# Patient Record
Sex: Female | Born: 1935 | Race: White | Hispanic: No | Smoking: Never smoker
Health system: Southern US, Community
[De-identification: ages and names within clinical notes are randomized; demographics above are authoritative.]

## PROBLEM LIST (undated history)

## (undated) DIAGNOSIS — K219 Gastro-esophageal reflux disease without esophagitis: Secondary | ICD-10-CM

## (undated) DIAGNOSIS — J45909 Unspecified asthma, uncomplicated: Secondary | ICD-10-CM

## (undated) DIAGNOSIS — R42 Dizziness and giddiness: Secondary | ICD-10-CM

## (undated) DIAGNOSIS — Z8489 Family history of other specified conditions: Secondary | ICD-10-CM

## (undated) DIAGNOSIS — Z970 Presence of artificial eye: Secondary | ICD-10-CM

## (undated) DIAGNOSIS — T8859XA Other complications of anesthesia, initial encounter: Secondary | ICD-10-CM

## (undated) DIAGNOSIS — M1990 Unspecified osteoarthritis, unspecified site: Secondary | ICD-10-CM

## (undated) DIAGNOSIS — R011 Cardiac murmur, unspecified: Secondary | ICD-10-CM

## (undated) DIAGNOSIS — M810 Age-related osteoporosis without current pathological fracture: Secondary | ICD-10-CM

## (undated) DIAGNOSIS — T4145XA Adverse effect of unspecified anesthetic, initial encounter: Secondary | ICD-10-CM

## (undated) DIAGNOSIS — R748 Abnormal levels of other serum enzymes: Secondary | ICD-10-CM

## (undated) DIAGNOSIS — I10 Essential (primary) hypertension: Secondary | ICD-10-CM

## (undated) DIAGNOSIS — I6529 Occlusion and stenosis of unspecified carotid artery: Secondary | ICD-10-CM

## (undated) DIAGNOSIS — E785 Hyperlipidemia, unspecified: Secondary | ICD-10-CM

## (undated) HISTORY — PX: ABDOMINAL HYSTERECTOMY: SHX81

## (undated) HISTORY — DX: Occlusion and stenosis of unspecified carotid artery: I65.29

## (undated) HISTORY — PX: CHOLECYSTECTOMY: SHX55

## (undated) HISTORY — PX: HAND / FINGER LESION EXCISION: SUR531

## (undated) HISTORY — PX: BREAST SURGERY: SHX581

## (undated) HISTORY — PX: APPENDECTOMY: SHX54

---

## 1973-07-04 HISTORY — PX: EYE SURGERY: SHX253

## 2003-11-02 ENCOUNTER — Other Ambulatory Visit: Payer: Self-pay

## 2007-03-22 ENCOUNTER — Other Ambulatory Visit: Payer: Self-pay

## 2007-03-22 ENCOUNTER — Emergency Department: Payer: Self-pay

## 2011-01-03 DIAGNOSIS — C439 Malignant melanoma of skin, unspecified: Secondary | ICD-10-CM | POA: Insufficient documentation

## 2011-01-03 DIAGNOSIS — J45909 Unspecified asthma, uncomplicated: Secondary | ICD-10-CM | POA: Insufficient documentation

## 2011-01-03 DIAGNOSIS — K219 Gastro-esophageal reflux disease without esophagitis: Secondary | ICD-10-CM | POA: Insufficient documentation

## 2011-01-03 DIAGNOSIS — E119 Type 2 diabetes mellitus without complications: Secondary | ICD-10-CM | POA: Insufficient documentation

## 2011-01-03 DIAGNOSIS — R55 Syncope and collapse: Secondary | ICD-10-CM | POA: Insufficient documentation

## 2011-01-03 DIAGNOSIS — M810 Age-related osteoporosis without current pathological fracture: Secondary | ICD-10-CM | POA: Insufficient documentation

## 2011-01-03 DIAGNOSIS — I10 Essential (primary) hypertension: Secondary | ICD-10-CM | POA: Insufficient documentation

## 2011-04-21 ENCOUNTER — Inpatient Hospital Stay: Payer: Self-pay | Admitting: Internal Medicine

## 2011-06-02 ENCOUNTER — Ambulatory Visit: Payer: Self-pay | Admitting: Gastroenterology

## 2011-08-28 ENCOUNTER — Emergency Department: Payer: Self-pay | Admitting: Emergency Medicine

## 2011-08-28 LAB — COMPREHENSIVE METABOLIC PANEL
Albumin: 3.8 g/dL (ref 3.4–5.0)
Alkaline Phosphatase: 61 U/L (ref 50–136)
BUN: 9 mg/dL (ref 7–18)
Creatinine: 0.83 mg/dL (ref 0.60–1.30)
Glucose: 99 mg/dL (ref 65–99)
Potassium: 4.1 mmol/L (ref 3.5–5.1)
SGOT(AST): 20 U/L (ref 15–37)
SGPT (ALT): 27 U/L
Total Protein: 7.7 g/dL (ref 6.4–8.2)

## 2011-08-28 LAB — PRO B NATRIURETIC PEPTIDE: B-Type Natriuretic Peptide: 105 pg/mL (ref 0–450)

## 2011-08-28 LAB — CBC
HGB: 14.7 g/dL (ref 12.0–16.0)
Platelet: 226 10*3/uL (ref 150–440)
RBC: 4.56 10*6/uL (ref 3.80–5.20)
WBC: 10.9 10*3/uL (ref 3.6–11.0)

## 2012-02-17 DIAGNOSIS — Z961 Presence of intraocular lens: Secondary | ICD-10-CM | POA: Insufficient documentation

## 2012-03-06 DIAGNOSIS — E785 Hyperlipidemia, unspecified: Secondary | ICD-10-CM | POA: Insufficient documentation

## 2013-06-04 ENCOUNTER — Ambulatory Visit: Payer: Self-pay | Admitting: Family Medicine

## 2013-10-17 ENCOUNTER — Ambulatory Visit: Payer: Self-pay | Admitting: Family Medicine

## 2014-12-03 ENCOUNTER — Encounter: Payer: Self-pay | Admitting: *Deleted

## 2014-12-03 DIAGNOSIS — Z789 Other specified health status: Secondary | ICD-10-CM | POA: Insufficient documentation

## 2014-12-03 NOTE — Discharge Instructions (Signed)

## 2014-12-04 ENCOUNTER — Telehealth: Payer: Self-pay | Admitting: Gastroenterology

## 2014-12-04 ENCOUNTER — Ambulatory Visit
Admission: RE | Admit: 2014-12-04 | Discharge: 2014-12-04 | Disposition: A | Discharge status: Home / Self Care | Payer: Medicare Other | Source: Ambulatory Visit | Attending: Gastroenterology | Admitting: Gastroenterology

## 2014-12-04 ENCOUNTER — Ambulatory Visit: Payer: Medicare Other | Admitting: Anesthesiology

## 2014-12-04 ENCOUNTER — Encounter
Admission: RE | Disposition: A | Discharge status: Home / Self Care | Payer: Self-pay | Source: Ambulatory Visit | Attending: Gastroenterology

## 2014-12-04 ENCOUNTER — Other Ambulatory Visit: Payer: Self-pay | Admitting: Gastroenterology

## 2014-12-04 DIAGNOSIS — I10 Essential (primary) hypertension: Secondary | ICD-10-CM | POA: Insufficient documentation

## 2014-12-04 DIAGNOSIS — K2970 Gastritis, unspecified, without bleeding: Secondary | ICD-10-CM | POA: Diagnosis not present

## 2014-12-04 DIAGNOSIS — J45909 Unspecified asthma, uncomplicated: Secondary | ICD-10-CM | POA: Insufficient documentation

## 2014-12-04 DIAGNOSIS — Z79899 Other long term (current) drug therapy: Secondary | ICD-10-CM | POA: Insufficient documentation

## 2014-12-04 DIAGNOSIS — E785 Hyperlipidemia, unspecified: Secondary | ICD-10-CM | POA: Diagnosis not present

## 2014-12-04 DIAGNOSIS — K219 Gastro-esophageal reflux disease without esophagitis: Secondary | ICD-10-CM | POA: Diagnosis not present

## 2014-12-04 DIAGNOSIS — M1990 Unspecified osteoarthritis, unspecified site: Secondary | ICD-10-CM | POA: Diagnosis not present

## 2014-12-04 DIAGNOSIS — K2950 Unspecified chronic gastritis without bleeding: Secondary | ICD-10-CM | POA: Diagnosis not present

## 2014-12-04 DIAGNOSIS — Z7951 Long term (current) use of inhaled steroids: Secondary | ICD-10-CM | POA: Insufficient documentation

## 2014-12-04 DIAGNOSIS — K228 Other specified diseases of esophagus: Secondary | ICD-10-CM | POA: Diagnosis not present

## 2014-12-04 DIAGNOSIS — R12 Heartburn: Secondary | ICD-10-CM

## 2014-12-04 HISTORY — DX: Essential (primary) hypertension: I10

## 2014-12-04 HISTORY — DX: Presence of artificial eye: Z97.0

## 2014-12-04 HISTORY — DX: Adverse effect of unspecified anesthetic, initial encounter: T41.45XA

## 2014-12-04 HISTORY — PX: ESOPHAGOGASTRODUODENOSCOPY: SHX5428

## 2014-12-04 HISTORY — DX: Family history of other specified conditions: Z84.89

## 2014-12-04 HISTORY — DX: Other complications of anesthesia, initial encounter: T88.59XA

## 2014-12-04 HISTORY — DX: Dizziness and giddiness: R42

## 2014-12-04 HISTORY — DX: Age-related osteoporosis without current pathological fracture: M81.0

## 2014-12-04 HISTORY — DX: Abnormal levels of other serum enzymes: R74.8

## 2014-12-04 HISTORY — DX: Hyperlipidemia, unspecified: E78.5

## 2014-12-04 HISTORY — DX: Cardiac murmur, unspecified: R01.1

## 2014-12-04 HISTORY — DX: Unspecified asthma, uncomplicated: J45.909

## 2014-12-04 HISTORY — DX: Unspecified osteoarthritis, unspecified site: M19.90

## 2014-12-04 HISTORY — DX: Gastro-esophageal reflux disease without esophagitis: K21.9

## 2014-12-04 SURGERY — EGD (ESOPHAGOGASTRODUODENOSCOPY)
Anesthesia: Monitor Anesthesia Care | Wound class: Clean Contaminated

## 2014-12-04 MED ORDER — ACETAMINOPHEN 160 MG/5ML PO SOLN
325.0000 mg | ORAL | Status: DC | PRN
Start: 2014-12-04 — End: 2014-12-04

## 2014-12-04 MED ORDER — GLYCOPYRROLATE 0.2 MG/ML IJ SOLN
INTRAMUSCULAR | Status: DC | PRN
  Administered 2014-12-04: 0.1 mg via INTRAVENOUS

## 2014-12-04 MED ORDER — LACTATED RINGERS IV SOLN
INTRAVENOUS | Status: DC
  Administered 2014-12-04 (×3): via INTRAVENOUS

## 2014-12-04 MED ORDER — ACETAMINOPHEN 325 MG PO TABS: 325.0000 mg | ORAL_TABLET | ORAL | Status: DC | PRN

## 2014-12-04 MED ORDER — LIDOCAINE HCL (CARDIAC) 20 MG/ML IV SOLN
INTRAVENOUS | Status: DC | PRN
  Administered 2014-12-04: 30 mg via INTRAVENOUS

## 2014-12-04 MED ORDER — PROPOFOL 10 MG/ML IV BOLUS
INTRAVENOUS | Status: DC | PRN
  Administered 2014-12-04: 60 mg via INTRAVENOUS
  Administered 2014-12-04 (×3): 20 mg via INTRAVENOUS

## 2014-12-04 SURGICAL SUPPLY — 39 items
BALLN DILATOR 10-12 8 (BALLOONS)
BALLN DILATOR 12-15 8 (BALLOONS)
BALLN DILATOR 15-18 8 (BALLOONS)
BALLN DILATOR CRE 0-12 8 (BALLOONS)
BALLN DILATOR ESOPH 8 10 CRE (MISCELLANEOUS) IMPLANT
BALLOON DILATOR 12-15 8 (BALLOONS) IMPLANT
BALLOON DILATOR 15-18 8 (BALLOONS) IMPLANT
BALLOON DILATOR CRE 0-12 8 (BALLOONS) IMPLANT
BLOCK BITE 60FR ADLT L/F GRN (MISCELLANEOUS) ×3 IMPLANT
CANISTER SUCT 1200ML W/VALVE (MISCELLANEOUS) ×3 IMPLANT
FCP ESCP3.2XJMB 240X2.8X (MISCELLANEOUS)
FORCEPS BIOP RAD 4 LRG CAP 4 (CUTTING FORCEPS) IMPLANT
FORCEPS BIOP RJ4 240 W/NDL (MISCELLANEOUS)
FORCEPS ESCP3.2XJMB 240X2.8X (MISCELLANEOUS) IMPLANT
GOWN CVR UNV OPN BCK APRN NK (MISCELLANEOUS) ×2 IMPLANT
GOWN ISOL THUMB LOOP REG UNIV (MISCELLANEOUS) ×4
HEMOCLIP INSTINCT (CLIP) IMPLANT
INJECTOR VARIJECT VIN23 (MISCELLANEOUS) IMPLANT
KIT CO2 TUBING (TUBING) IMPLANT
KIT DEFENDO VALVE AND CONN (KITS) IMPLANT
KIT ENDO PROCEDURE OLY (KITS) ×3 IMPLANT
LIGATOR MULTIBAND 6SHOOTER MBL (MISCELLANEOUS) IMPLANT
MARKER SPOT ENDO TATTOO 5ML (MISCELLANEOUS) IMPLANT
PAD GROUND ADULT SPLIT (MISCELLANEOUS) IMPLANT
SNARE SHORT THROW 13M SML OVAL (MISCELLANEOUS) IMPLANT
SNARE SHORT THROW 30M LRG OVAL (MISCELLANEOUS) IMPLANT
SPOT EX ENDOSCOPIC TATTOO (MISCELLANEOUS)
SUCTION POLY TRAP 4CHAMBER (MISCELLANEOUS) IMPLANT
SYR INFLATION 60ML (SYRINGE) IMPLANT
TRAP SUCTION POLY (MISCELLANEOUS) IMPLANT
TUBING CONN 6MMX3.1M (TUBING)
TUBING SUCTION CONN 0.25 STRL (TUBING) IMPLANT
UNDERPAD 30X60 958B10 (PK) (MISCELLANEOUS) IMPLANT
VALVE BIOPSY ENDO (VALVE) IMPLANT
VARIJECT INJECTOR VIN23 (MISCELLANEOUS)
WATER AUXILLARY (MISCELLANEOUS) IMPLANT
WATER STERILE IRR 250ML POUR (IV SOLUTION) ×3 IMPLANT
WATER STERILE IRR 500ML POUR (IV SOLUTION) IMPLANT
WIRE CRE 18-20MM 8CM F G (MISCELLANEOUS) IMPLANT

## 2014-12-04 NOTE — Op Note (Signed)
[facility] Patient Name: Jaime Berger Procedure Date: 12/04/2014 8:22 AM MRN: [id number] Account #: [id number] Date of Birth: 1930-01-07 Admit Type: Outpatient Age: 81 Room: [facility] OR ROOM 01 Gender: Female Note Status: Finalized Procedure:         Upper GI endoscopy Indications:       Heartburn Providers:         Lucilla Lame, MD Referring MD:      Kerin Perna, MD (Referring MD) Medicines:         Propofol per Anesthesia Complications:     No immediate complications. Procedure:         Pre-Anesthesia Assessment:                    - Prior to the procedure, a History and Physical was                     performed, and patient medications and allergies were                     reviewed. The patient's tolerance of previous anesthesia                     was also reviewed. The risks and benefits of the procedure                     and the sedation options and risks were discussed with the                     patient. All questions were answered, and informed consent                     was obtained. Prior Anticoagulants: The patient has taken                     no previous anticoagulant or antiplatelet agents. ASA                     Grade Assessment: II - A patient with mild systemic                     disease. After reviewing the risks and benefits, the                     patient was deemed in satisfactory condition to undergo                     the procedure.                    After obtaining informed consent, the endoscope was passed                     under direct vision. Throughout the procedure, the                     patient's blood pressure, pulse, and oxygen saturations                     were monitored continuously. The Olympus GIF H180J                     colonscope (B#:[id number]) was introduced through the mouth,  and advanced to the second part of duodenum. The upper GI                     endoscopy was  accomplished without difficulty. The patient                     tolerated the procedure well. Findings:      The Z-line was irregular and was found at the gastroesophageal junction.       Biopsies were taken with a cold forceps for histology.      Localized moderate inflammation characterized by erythema was found in       the gastric antrum. Biopsies were taken with a cold forceps for       histology.      The examined duodenum was normal. Impression:        - Z-line irregular, at the gastroesophageal junction.                     Biopsied.                    - Gastritis. Biopsied.                    - Normal examined duodenum. Recommendation:    - Await pathology results. Procedure Code(s): --- Professional ---                    [phone], Esophagogastroduodenoscopy, flexible, transoral;                     with biopsy, single or multiple Diagnosis Code(s): --- Professional ---                    R12, Heartburn                    K22.8, Other specified diseases of esophagus                    K29.70, Gastritis, unspecified, without bleeding CPT copyright 2014 American Medical Association. All rights reserved. The codes documented in this report are preliminary and upon coder review may  be revised to meet current compliance requirements. Lucilla Lame, MD 12/04/2014 8:44:40 AM This report has been signed electronically. Number of Addenda: 0 Note Initiated On: 12/04/2014 8:22 AM Total Procedure Duration: 0 hours 3 minutes 15 seconds       [facility]

## 2014-12-04 NOTE — H&P (Signed)
[facility]  [address] [place], [place] [address] Phone: [phone] Fax : [phone]  Primary Care Physician:  Hortencia Pilar, MD Primary Gastroenterologist:  Dr. Allen Norris  Pre-Procedure History & Physical: HPI:  Jaime Berger is a 81 y.o. female is here for an endoscopy.   Past Medical History  Diagnosis Date  . Family history of adverse reaction to anesthesia     nausea  . Complication of anesthesia     Pt reports during Colonoscopy at [facility] was "overdosed" on meds and died and was brought back  . Hypertension   . Heart murmur     "Yrs ago", no mention recently  . Arthritis     legs, hands, "everywhere"  . GERD (gastroesophageal reflux disease)   . Asthma   . Osteoporosis     back  . Prosthetic eye globe     right  . Vertigo   . Hyperlipidemia   . Liver enzyme elevation     Past Surgical History  Procedure Laterality Date  . Breast surgery Right     lumpectomy  . Cholecystectomy    . Appendectomy    . Abdominal hysterectomy    . Eye surgery Right 1975     prosthesis after melanoma  . Hand / finger lesion excision      "nodules"    Prior to Admission medications   Medication Sig Start Date End Date Taking? Authorizing Provider  albuterol (PROVENTIL HFA;VENTOLIN HFA) 108 (90 BASE) MCG/ACT inhaler Inhale into the lungs every 6 (six) hours as needed for wheezing or shortness of breath.   Yes Historical Provider, MD  budesonide-formoterol (SYMBICORT) 160-4.5 MCG/ACT inhaler Inhale 2 puffs into the lungs 2 (two) times daily. Usually only uses in AM   Yes Historical Provider, MD  carvedilol (COREG) 6.25 MG tablet Take 6.25 mg by mouth daily. 10 AM   Yes Historical Provider, MD  dexlansoprazole (DEXILANT) 60 MG capsule Take 60 mg by mouth daily. PM   Yes Historical Provider, MD  diltiazem (CARDIZEM) 120 MG tablet Take 120 mg by mouth daily. 10 AM   Yes Historical Provider, MD  losartan (COZAAR) 100 MG tablet Take 100 mg by mouth daily. 10 AM    Yes Historical Provider, MD    Allergies as of 12/03/2014 - Review Complete 12/03/2014  Allergen Reaction Noted  . Lipitor [atorvastatin] Other (See Comments) 12/03/2014  . Lovastatin Other (See Comments) 12/03/2014  . Doxycycline Nausea And Vomiting 12/03/2014  . Strawberry Itching 12/03/2014  . Sulfa antibiotics Hives 12/03/2014  . Metformin and related Palpitations 12/03/2014    History reviewed. No pertinent family history.  History   Social History  . Marital Status: Widowed    Spouse Name: N/A  . Number of Children: N/A  . Years of Education: N/A   Occupational History  . Not on file.   Social History Main Topics  . Smoking status: Never Smoker   . Smokeless tobacco: Not on file  . Alcohol Use: No  . Drug Use: Not on file  . Sexual Activity: Not on file   Other Topics Concern  . Not on file   Social History Narrative  . No narrative on file    Review of Systems: See HPI, otherwise negative ROS  Physical Exam: BP 167/74 mmHg  Pulse 67  Temp(Src) 97.5 F (36.4 C) (Temporal)  Resp 16  Ht 5\' 1"  (1.549 m)  Wt 141 lb (63.957 kg)  BMI 26.66 kg/m2  SpO2 98% General:  Alert,  pleasant and cooperative in NAD Head:  Normocephalic and atraumatic. Neck:  Supple; no masses or thyromegaly. Lungs:  Clear throughout to auscultation.    Heart:  Regular rate and rhythm. Abdomen:  Soft, nontender and nondistended. Normal bowel sounds, without guarding, and without rebound.   Neurologic:  Alert and  oriented x4;  grossly normal neurologically.  Impression/Plan: Jaime Berger is here for an endoscopy to be performed for GERD  Risks, benefits, limitations, and alternatives regarding  endoscopy have been reviewed with the patient.  Questions have been answered.  All parties agreeable.   Ollen Bowl, MD  12/04/2014, 8:26 AM

## 2014-12-04 NOTE — Anesthesia Postprocedure Evaluation (Signed)
  Anesthesia Post-op Note  Patient: Jaime Berger  Procedure(s) Performed: Procedure(s) with comments: ESOPHAGOGASTRODUODENOSCOPY (EGD) (N/A) - REFERRING DR Hortencia Pilar  Anesthesia type:MAC  Patient location: PACU  Post pain: Pain level controlled  Post assessment: Post-op Vital signs reviewed, Patient's Cardiovascular Status Stable, Respiratory Function Stable, Patent Airway and No signs of Nausea or vomiting  Post vital signs: Reviewed and stable  Last Vitals:  Filed Vitals:   12/04/14 0930  BP: 197/70  Pulse: 66  Temp:   Resp: 16    Level of consciousness: awake, alert  and patient cooperative  Complications: No apparent anesthesia complications

## 2014-12-04 NOTE — Anesthesia Preprocedure Evaluation (Addendum)
Anesthesia Evaluation  Patient identified by MRN, date of birth, ID band  Reviewed: Allergy & Precautions, H&P , NPO status , Patient's Chart, lab work & pertinent test results  Airway Mallampati: II  TM Distance: >3 FB Neck ROM: full    Dental no notable dental hx.    Pulmonary    Pulmonary exam normal       Cardiovascular hypertension, Rhythm:regular Rate:Normal + Systolic murmurs    Neuro/Psych    GI/Hepatic   Endo/Other    Renal/GU      Musculoskeletal   Abdominal   Peds  Hematology   Anesthesia Other Findings   Reproductive/Obstetrics                            Anesthesia Physical Anesthesia Plan  ASA: II  Anesthesia Plan: MAC   Post-op Pain Management:    Induction:   Airway Management Planned:   Additional Equipment:   Intra-op Plan:   Post-operative Plan:   Informed Consent: I have reviewed the patients History and Physical, chart, labs and discussed the procedure including the risks, benefits and alternatives for the proposed anesthesia with the patient or authorized representative who has indicated his/her understanding and acceptance.     Plan Discussed with: CRNA  Anesthesia Plan Comments:         Anesthesia Quick Evaluation

## 2014-12-04 NOTE — Telephone Encounter (Signed)
Call daughter for results

## 2014-12-04 NOTE — Transfer of Care (Signed)
Immediate Anesthesia Transfer of Care Note  Patient: Jaime Berger  Procedure(s) Performed: Procedure(s) with comments: ESOPHAGOGASTRODUODENOSCOPY (EGD) (N/A) - REFERRING DR Hortencia Pilar  Patient Location: PACU  Anesthesia Type: MAC  Level of Consciousness: awake, alert  and patient cooperative  Airway and Oxygen Therapy: Patient Spontanous Breathing and Patient connected to supplemental oxygen  Post-op Assessment: Post-op Vital signs reviewed, Patient's Cardiovascular Status Stable, Respiratory Function Stable, Patent Airway and No signs of Nausea or vomiting  Post-op Vital Signs: Reviewed and stable  Complications: No apparent anesthesia complications

## 2014-12-04 NOTE — Telephone Encounter (Signed)
Spoke with pt about her results. Advised her I have tried contacting her daughter, but number is incorrect.

## 2014-12-05 ENCOUNTER — Encounter: Payer: Self-pay | Admitting: Gastroenterology

## 2014-12-05 ENCOUNTER — Other Ambulatory Visit: Payer: Self-pay

## 2014-12-05 DIAGNOSIS — R748 Abnormal levels of other serum enzymes: Secondary | ICD-10-CM

## 2014-12-10 NOTE — Telephone Encounter (Signed)
Spoke with pt's daughter regarding her results. Advised her Dr. Allen Norris has added more labs. Pt will go have these done as soon as she can. Dr. Allen Norris has requested pt to follow up with him once these are done.

## 2014-12-17 ENCOUNTER — Telehealth: Payer: Self-pay

## 2014-12-17 NOTE — Telephone Encounter (Signed)
-----   Message from Lucilla Lame, MD sent at 12/08/2014 11:44 AM EDT ----- Have the patient come in for an office visit to discuss the pathology.

## 2014-12-17 NOTE — Telephone Encounter (Signed)
Spoke with pt's daughter. Pt scheduled for a f/u of pathology results on 01-01-15.

## 2015-01-01 ENCOUNTER — Encounter: Payer: Self-pay | Admitting: Gastroenterology

## 2015-01-01 ENCOUNTER — Ambulatory Visit (INDEPENDENT_AMBULATORY_CARE_PROVIDER_SITE_OTHER): Payer: Medicare Other | Admitting: Gastroenterology

## 2015-01-01 VITALS — BP 171/80 | HR 66 | Temp 98.6°F | Ht 61.5 in | Wt 146.0 lb

## 2015-01-01 DIAGNOSIS — H5440 Blindness, one eye, unspecified eye: Secondary | ICD-10-CM | POA: Insufficient documentation

## 2015-01-01 DIAGNOSIS — K219 Gastro-esophageal reflux disease without esophagitis: Secondary | ICD-10-CM | POA: Diagnosis not present

## 2015-01-01 NOTE — Progress Notes (Signed)
Primary Care Physician: Hortencia Pilar, MD  Primary Gastroenterologist:  Dr. Lucilla Lame  Chief Complaint  Patient presents with  . F/U procedure results    HPI: Jaime Berger is a 81 y.o. female here for follow-up of abnormal liver enzymes and biopsies of her stomach. The patient states that she has been doing much better on the [place] and when she tried to stop it had the nausea and vomiting return. The patient states she is now gaining weight and has been feeling well. The patient's liver enzymes have been elevated her most recent blood work showed that she had no source of the abnormal liver enzymes. The patient did have an ultrasound that showed fatty liver. The patient had an upper endoscopy that showed an irregular Z line incision with chronic history of reflux and intestinal metaplasia of the stomach.  Current Outpatient Prescriptions  Medication Sig Dispense Refill  . albuterol (PROVENTIL HFA;VENTOLIN HFA) 108 (90 BASE) MCG/ACT inhaler Inhale into the lungs every 6 (six) hours as needed for wheezing or shortness of breath.    . budesonide-formoterol (SYMBICORT) 160-4.5 MCG/ACT inhaler Inhale 2 puffs into the lungs 2 (two) times daily. Usually only uses in AM    . carvedilol (COREG) 6.25 MG tablet Take 6.25 mg by mouth daily. 10 AM    . dexlansoprazole (DEXILANT) 60 MG capsule Take 60 mg by mouth daily. PM    . diltiazem (CARDIZEM) 120 MG tablet Take 120 mg by mouth daily. 10 AM    . ipratropium (ATROVENT) 0.06 % nasal spray Place into the nose.    . losartan (COZAAR) 100 MG tablet Take 100 mg by mouth daily. 10 AM    . Nutritional Supplements (PROTEIN SUPPLEMENT 80% PO) Take by mouth.    Marland Kitchen aspirin 81 MG chewable tablet Chew by mouth.    . CARTIA XT 120 MG 24 hr capsule   0  . diclofenac sodium (VOLTAREN) 1 % GEL APP 2 GRAMS EXT AA BID PRN  0  . erythromycin ophthalmic ointment APP THIN LAYER IN OD TID  0  . Vitamin D, Ergocalciferol, (DRISDOL) 50000 UNITS CAPS capsule Take  by mouth.     No current facility-administered medications for this visit.    Allergies as of 01/01/2015 - Review Complete 01/01/2015  Allergen Reaction Noted  . Lipitor [atorvastatin] Other (See Comments) 12/03/2014  . Lovastatin Other (See Comments) 12/03/2014  . Doxycycline Nausea And Vomiting 12/03/2014  . Sulfa antibiotics Hives 12/03/2014  . Metformin and related Palpitations 12/03/2014    ROS:  General: Negative for anorexia, weight loss, fever, chills, fatigue, weakness. ENT: Negative for hoarseness, difficulty swallowing , nasal congestion. CV: Negative for chest pain, angina, palpitations, dyspnea on exertion, peripheral edema.  Respiratory: Negative for dyspnea at rest, dyspnea on exertion, cough, sputum, wheezing.  GI: See history of present illness. GU:  Negative for dysuria, hematuria, urinary incontinence, urinary frequency, nocturnal urination.  Endo: Negative for unusual weight change.    Physical Examination:   BP 171/80 mmHg  Pulse 66  Temp(Src) 98.6 F (37 C) (Oral)  Ht 5' 1.5" (1.562 m)  Wt 146 lb (66.225 kg)  BMI 27.14 kg/m2  General: Well-nourished, well-developed in no acute distress.  Eyes: No icterus. Conjunctivae pink. Mouth: Oropharyngeal mucosa moist and pink , no lesions erythema or exudate. Lungs: Clear to auscultation bilaterally. Non-labored. Heart: Regular rate and rhythm, no murmurs rubs or gallops.  Abdomen: Bowel sounds are normal, nontender, nondistended, no hepatosplenomegaly or masses, no abdominal bruits or  hernia , no rebound or guarding.   Extremities: No lower extremity edema. No clubbing or deformities. Neuro: Alert and oriented x 3.  Grossly intact. Skin: Warm and dry, no jaundice.   Psych: Alert and cooperative, normal mood and affect.  Labs:    Imaging Studies: No results found.  Assessment and Plan:   Jaime Berger is a 81 y.o. y/o female comes today for follow-up of abnormal liver enzymes and biopsies of her  stomach. The biopsies of the stomach showed intestinal metaplasia and she will have her blood sent off for H. pylori. The patient will be treated for H. pylori if the intestinal metaplasia is caused by her having H. pylori. The patient has also been sent off to have her liver enzymes rechecked. The patient's increased liver enzymes are likely caused from fatty liver and a liver biopsy on this very pleasant 81 year old would not be advisable at this time. The patient does very poorly off of PPI and despite having risks associated with the PPI her positive life is much worse when she is not on a PPI. We'll continue the PPI. The patient has been explained the plan and agrees with it.

## 2015-01-02 LAB — HEPATIC FUNCTION PANEL
ALBUMIN: 4.4 g/dL (ref 3.5–4.7)
ALT: 53 IU/L — ABNORMAL HIGH (ref 0–32)
AST: 79 IU/L — ABNORMAL HIGH (ref 0–40)
Alkaline Phosphatase: 67 IU/L (ref 39–117)
Bilirubin Total: 0.5 mg/dL (ref 0.0–1.2)
Bilirubin, Direct: 0.18 mg/dL (ref 0.00–0.40)
Total Protein: 6.9 g/dL (ref 6.0–8.5)

## 2015-01-03 LAB — H. PYLORI ANTIBODY, IGA

## 2015-01-07 ENCOUNTER — Telehealth: Payer: Self-pay

## 2015-01-07 NOTE — Telephone Encounter (Signed)
-----   Message from Lucilla Lame, MD sent at 01/06/2015 12:52 PM EDT ----- Let the patient know that her liver enzymes are still elevated but the blood test for a infection of her stomach was negative. She should have her liver enzymes checked again in 3 months.

## 2015-01-07 NOTE — Telephone Encounter (Signed)
LVM for pt's daughter to return my call.  

## 2015-01-07 NOTE — Telephone Encounter (Signed)
Pt's daughter returned my call and results given. Also advised of repeat LFT's in 3 months.

## 2015-01-12 ENCOUNTER — Other Ambulatory Visit: Payer: Self-pay

## 2015-01-12 DIAGNOSIS — K2990 Gastroduodenitis, unspecified, without bleeding: Principal | ICD-10-CM

## 2015-01-12 DIAGNOSIS — K2970 Gastritis, unspecified, without bleeding: Secondary | ICD-10-CM

## 2015-01-12 MED ORDER — SUCRALFATE 1 G PO TABS: 1.0000 g | ORAL_TABLET | Freq: Four times a day (QID) | ORAL | Status: DC

## 2015-01-13 ENCOUNTER — Other Ambulatory Visit: Payer: Self-pay

## 2015-01-13 DIAGNOSIS — K2970 Gastritis, unspecified, without bleeding: Secondary | ICD-10-CM

## 2015-01-13 DIAGNOSIS — K2990 Gastroduodenitis, unspecified, without bleeding: Principal | ICD-10-CM

## 2015-01-13 MED ORDER — SUCRALFATE 1 G PO TABS: 1.0000 g | ORAL_TABLET | Freq: Four times a day (QID) | ORAL | Status: DC

## 2015-03-03 DIAGNOSIS — K2940 Chronic atrophic gastritis without bleeding: Secondary | ICD-10-CM | POA: Insufficient documentation

## 2015-04-08 ENCOUNTER — Telehealth: Payer: Self-pay

## 2015-04-08 NOTE — Telephone Encounter (Signed)
LVM for pt to return my call.

## 2015-04-08 NOTE — Telephone Encounter (Signed)
-----   Message from [place], [place] sent at 01/07/2015  1:56 PM EDT ----- Regarding: labs Call pt's daughter Sydell Axon to remind of 3 month repeat of Hepatic function.

## 2015-04-10 ENCOUNTER — Other Ambulatory Visit: Payer: Self-pay

## 2015-04-10 DIAGNOSIS — R748 Abnormal levels of other serum enzymes: Secondary | ICD-10-CM

## 2015-04-10 NOTE — Telephone Encounter (Signed)
No return call to schedule to repeat labs. Mailed lab order to pt with note to go to [facility].

## 2015-04-10 NOTE — Telephone Encounter (Signed)
-----   Message from [place]ie [place], [place] sent at 01/07/2015  1:56 PM EDT ----- Regarding: labs Call pt's daughter Sydell Axon to remind of 3 month repeat of Hepatic function.

## 2015-04-16 LAB — HEPATIC FUNCTION PANEL
ALBUMIN: 4.1 g/dL (ref 3.5–4.7)
ALK PHOS: 68 IU/L (ref 39–117)
ALT: 41 IU/L — ABNORMAL HIGH (ref 0–32)
AST: 58 IU/L — ABNORMAL HIGH (ref 0–40)
BILIRUBIN TOTAL: 0.5 mg/dL (ref 0.0–1.2)
BILIRUBIN, DIRECT: 0.18 mg/dL (ref 0.00–0.40)
Total Protein: 6.4 g/dL (ref 6.0–8.5)

## 2015-04-29 ENCOUNTER — Telehealth: Payer: Self-pay | Admitting: Gastroenterology

## 2015-04-29 NOTE — Telephone Encounter (Signed)
Please call daughter about patients labs from 10/5  [phone]

## 2015-04-29 NOTE — Telephone Encounter (Signed)
Pts daughter notified of lab results

## 2015-05-04 ENCOUNTER — Telehealth: Payer: Self-pay

## 2015-05-04 NOTE — Telephone Encounter (Signed)
Pts daughter notified of lab results

## 2015-05-04 NOTE — Telephone Encounter (Signed)
-----   Message from Lucilla Lame, MD sent at 05/04/2015 11:37 AM EDT ----- Let the patient know that the liver enzymes have gone down somewhat and she should have them checked again in 3 months.

## 2015-06-18 ENCOUNTER — Telehealth: Payer: Self-pay | Admitting: Gastroenterology

## 2015-06-18 NOTE — Telephone Encounter (Signed)
Jaime Berger called to ask a couple of questions regarding her mother, Jaime Berger.

## 2015-06-18 NOTE — Telephone Encounter (Signed)
Spoke with pt's daughter Sydell Axon regarding her mother losing 30 lbs in a little over a months time. Advised her to contact her PCP and get her in soon. If her PCP feels this is a GI related issue then to call me back and I will get her in ASAP with Dr. Allen Norris.

## 2015-06-18 NOTE — Telephone Encounter (Signed)
LVM for pt to return my call.

## 2015-09-19 DIAGNOSIS — R002 Palpitations: Secondary | ICD-10-CM | POA: Insufficient documentation

## 2015-10-07 ENCOUNTER — Other Ambulatory Visit: Payer: Self-pay

## 2015-10-07 DIAGNOSIS — R748 Abnormal levels of other serum enzymes: Secondary | ICD-10-CM

## 2015-11-05 ENCOUNTER — Inpatient Hospital Stay
Admission: EM | Admit: 2015-11-05 | Discharge: 2015-11-08 | DRG: 439 | Disposition: A | Discharge status: Home / Self Care | Payer: Medicare Other | Attending: Internal Medicine | Admitting: Internal Medicine

## 2015-11-05 ENCOUNTER — Encounter: Payer: Self-pay | Admitting: *Deleted

## 2015-11-05 DIAGNOSIS — Z85828 Personal history of other malignant neoplasm of skin: Secondary | ICD-10-CM

## 2015-11-05 DIAGNOSIS — E785 Hyperlipidemia, unspecified: Secondary | ICD-10-CM | POA: Diagnosis present

## 2015-11-05 DIAGNOSIS — I10 Essential (primary) hypertension: Secondary | ICD-10-CM | POA: Diagnosis present

## 2015-11-05 DIAGNOSIS — Z888 Allergy status to other drugs, medicaments and biological substances status: Secondary | ICD-10-CM

## 2015-11-05 DIAGNOSIS — J449 Chronic obstructive pulmonary disease, unspecified: Secondary | ICD-10-CM | POA: Diagnosis present

## 2015-11-05 DIAGNOSIS — Z79899 Other long term (current) drug therapy: Secondary | ICD-10-CM

## 2015-11-05 DIAGNOSIS — K8500 Idiopathic acute pancreatitis without necrosis or infection: Secondary | ICD-10-CM | POA: Diagnosis present

## 2015-11-05 DIAGNOSIS — K2900 Acute gastritis without bleeding: Secondary | ICD-10-CM | POA: Diagnosis present

## 2015-11-05 DIAGNOSIS — R739 Hyperglycemia, unspecified: Secondary | ICD-10-CM | POA: Diagnosis present

## 2015-11-05 DIAGNOSIS — Z7982 Long term (current) use of aspirin: Secondary | ICD-10-CM | POA: Diagnosis not present

## 2015-11-05 DIAGNOSIS — Z970 Presence of artificial eye: Secondary | ICD-10-CM | POA: Diagnosis not present

## 2015-11-05 DIAGNOSIS — K859 Acute pancreatitis without necrosis or infection, unspecified: Secondary | ICD-10-CM | POA: Diagnosis present

## 2015-11-05 DIAGNOSIS — Z882 Allergy status to sulfonamides status: Secondary | ICD-10-CM | POA: Diagnosis not present

## 2015-11-05 DIAGNOSIS — Z7951 Long term (current) use of inhaled steroids: Secondary | ICD-10-CM

## 2015-11-05 DIAGNOSIS — N390 Urinary tract infection, site not specified: Secondary | ICD-10-CM | POA: Diagnosis present

## 2015-11-05 DIAGNOSIS — K219 Gastro-esophageal reflux disease without esophagitis: Secondary | ICD-10-CM | POA: Diagnosis present

## 2015-11-05 DIAGNOSIS — R1013 Epigastric pain: Secondary | ICD-10-CM | POA: Diagnosis present

## 2015-11-05 DIAGNOSIS — H5441 Blindness, right eye, normal vision left eye: Secondary | ICD-10-CM | POA: Diagnosis present

## 2015-11-05 DIAGNOSIS — M810 Age-related osteoporosis without current pathological fracture: Secondary | ICD-10-CM | POA: Diagnosis present

## 2015-11-05 LAB — URINALYSIS COMPLETE WITH MICROSCOPIC (ARMC ONLY)
BILIRUBIN URINE: NEGATIVE
Glucose, UA: NEGATIVE mg/dL
Hgb urine dipstick: NEGATIVE
Ketones, ur: NEGATIVE mg/dL
Nitrite: NEGATIVE
PH: 6 (ref 5.0–8.0)
PROTEIN: NEGATIVE mg/dL
Specific Gravity, Urine: 1.008 (ref 1.005–1.030)

## 2015-11-05 LAB — CBC
HCT: 42.7 % (ref 35.0–47.0)
Hemoglobin: 14.2 g/dL (ref 12.0–16.0)
MCH: 32 pg (ref 26.0–34.0)
MCHC: 33.3 g/dL (ref 32.0–36.0)
MCV: 96.1 fL (ref 80.0–100.0)
PLATELETS: 220 10*3/uL (ref 150–440)
RBC: 4.44 MIL/uL (ref 3.80–5.20)
RDW: 14.3 % (ref 11.5–14.5)
WBC: 9.3 10*3/uL (ref 3.6–11.0)

## 2015-11-05 LAB — COMPREHENSIVE METABOLIC PANEL
ALBUMIN: 4.1 g/dL (ref 3.5–5.0)
ALT: 26 U/L (ref 14–54)
AST: 31 U/L (ref 15–41)
Alkaline Phosphatase: 48 U/L (ref 38–126)
Anion gap: 10 (ref 5–15)
BUN: 18 mg/dL (ref 6–20)
CHLORIDE: 103 mmol/L (ref 101–111)
CO2: 24 mmol/L (ref 22–32)
CREATININE: 0.78 mg/dL (ref 0.44–1.00)
Calcium: 9.2 mg/dL (ref 8.9–10.3)
GFR calc non Af Amer: >60 mL/min (ref >60)
Glucose, Bld: 118 mg/dL — ABNORMAL HIGH (ref 65–99)
Potassium: 4 mmol/L (ref 3.5–5.1)
SODIUM: 137 mmol/L (ref 135–145)
Total Bilirubin: 0.6 mg/dL (ref 0.3–1.2)
Total Protein: 7.1 g/dL (ref 6.5–8.1)

## 2015-11-05 LAB — LIPASE, BLOOD: LIPASE: 238 U/L — AB (ref 11–51)

## 2015-11-05 LAB — TROPONIN I: Troponin I: <0.03 ng/mL

## 2015-11-05 MED ORDER — GI COCKTAIL ~~LOC~~
30.0000 mL | Freq: Once | ORAL | Status: AC
  Administered 2015-11-05: 30 mL via ORAL

## 2015-11-05 MED ORDER — GI COCKTAIL ~~LOC~~
ORAL | Status: AC
  Filled 2015-11-05: qty 30

## 2015-11-05 MED ORDER — ONDANSETRON 4 MG PO TBDP
4.0000 mg | ORAL_TABLET | Freq: Once | ORAL | Status: AC
  Administered 2015-11-05: 4 mg via ORAL
  Filled 2015-11-05: qty 1

## 2015-11-05 MED ORDER — FLUCONAZOLE 50 MG PO TABS
150.0000 mg | ORAL_TABLET | ORAL | Status: AC
  Administered 2015-11-06: 150 mg via ORAL
  Filled 2015-11-05: qty 1

## 2015-11-05 NOTE — ED Notes (Signed)
Pt reports that she has gastritis - she states that she is not able to eat out in restaurants but that she did today and ever since she has been having nausea and vomiting - symptoms have only been occuring today and she is unable to get the nausea and vomiting under control - pt reports burning in mid epigastric area but reports no pain at this time

## 2015-11-05 NOTE — ED Notes (Signed)
Pt to ED from home with "flare up of gastritis" Pt states nausea and abd cramping that comes and goes after eating. Pt states ate sausage this am and has not felt well since. Pt denies any emesis today or diarrhea.

## 2015-11-05 NOTE — ED Notes (Signed)
Lab notified to add troponin and pt notified that we need a urine sample

## 2015-11-05 NOTE — ED Provider Notes (Signed)
[facility] Emergency Department Provider Note  Time seen: 9:36 PM  I have reviewed the triage vital signs and the nursing notes.   HISTORY  Chief Complaint Nausea and Abdominal Pain    HPI Jaime Berger is a 82 y.o. female with a past medical history of hypertension, arthritis, gastric reflux, vertigo, hyperlipidemia who presents the emergency department with upper abdominal pain and burning. According to the patient she has a significant history of gastric reflux and gastritis. She ate sausage this morning which started her epigastric abdominal pain and nausea but states it is not resolved in fact it has worsened throughout the day so eventually she came to the emergency department for evaluation. Denies any chest pain, trouble breathing, or vomiting but states she does feel very nauseous. Patient describes her pain as moderate, burning sensation to the epigastrium. Denies diarrhea, dysuria, fever. States she is sure that it is her gastritis flaring up.     Past Medical History  Diagnosis Date  . Family history of adverse reaction to anesthesia     nausea  . Complication of anesthesia     Pt reports during Colonoscopy at [facility] was "overdosed" on meds and died and was brought back  . Hypertension   . Heart murmur     "Yrs ago", no mention recently  . Arthritis     legs, hands, "everywhere"  . GERD (gastroesophageal reflux disease)   . Asthma   . Osteoporosis     back  . Prosthetic eye globe     right  . Vertigo   . Hyperlipidemia   . Liver enzyme elevation     Patient Active Problem List   Diagnosis Date Noted  . Blind one eye 01/01/2015  . Drug intolerance 12/03/2014  . HLD (hyperlipidemia) 03/06/2012  . Pseudoaphakia 02/17/2012  . Airway hyperreactivity 01/03/2011  . Controlled diabetes mellitus type II without complication ([place]) [id number]  . Acid reflux 01/03/2011  . Benign essential HTN 01/03/2011  . 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 OP (osteoporosis) 01/03/2011  . Syncope and collapse 01/03/2011    Past Surgical History  Procedure Laterality Date  . Breast surgery Right     lumpectomy  . Cholecystectomy    . Appendectomy    . Abdominal hysterectomy    . Eye surgery Right 1975     prosthesis after melanoma  . Hand / finger lesion excision      "nodules"  . Esophagogastroduodenoscopy N/A 12/04/2014    Procedure: ESOPHAGOGASTRODUODENOSCOPY (EGD);  Surgeon: Lucilla Lame, MD;  Location: [place];  Service: Gastroenterology;  Laterality: N/A;  REFERRING DR Hortencia Pilar    Current Outpatient Rx  Name  Route  Sig  Dispense  Refill  . albuterol (PROVENTIL HFA;VENTOLIN HFA) 108 (90 BASE) MCG/ACT inhaler   Inhalation   Inhale into the lungs every 6 (six) hours as needed for wheezing or shortness of breath.         Marland Kitchen aspirin 81 MG chewable tablet   Oral   Chew by mouth.         . budesonide-formoterol (SYMBICORT) 160-4.5 MCG/ACT inhaler   Inhalation   Inhale 2 puffs into the lungs 2 (two) times daily. Usually only uses in AM         . CARTIA XT 120 MG 24 hr capsule            0     Dispense as written.   . carvedilol (COREG) 6.25 MG tablet  Oral   Take 6.25 mg by mouth daily. 10 AM         . dexlansoprazole (DEXILANT) 60 MG capsule   Oral   Take 60 mg by mouth daily. PM         . diclofenac sodium (VOLTAREN) 1 % GEL      APP 2 GRAMS EXT AA BID PRN      0   . diltiazem (CARDIZEM) 120 MG tablet   Oral   Take 120 mg by mouth daily. 10 AM         . erythromycin ophthalmic ointment      APP THIN LAYER IN OD TID      0   . EXPIRED: ipratropium (ATROVENT) 0.06 % nasal spray   Nasal   Place into the nose.         . losartan (COZAAR) 100 MG tablet   Oral   Take 100 mg by mouth daily. 10 AM         . Nutritional Supplements (PROTEIN SUPPLEMENT 80% PO)   Oral   Take by mouth.         . sucralfate (CARAFATE) 1 G tablet   Oral   Take 1 tablet (1 g  total) by mouth 4 (four) times daily.   120 tablet   5   . Vitamin D, Ergocalciferol, (DRISDOL) 50000 UNITS CAPS capsule   Oral   Take by mouth.           Allergies Lipitor; Lovastatin; Doxycycline; Sulfa antibiotics; and Metformin and related  History reviewed. No pertinent family history.  Social History Social History  Substance Use Topics  . Smoking status: Never Smoker   . Smokeless tobacco: None  . Alcohol Use: No    Review of Systems Constitutional: Negative for fever. Cardiovascular: Negative for chest pain. Respiratory: Negative for shortness of breath. Gastrointestinal: Epigastric abdominal burning pain positive for nausea. Negative for vomiting or diarrhea. Genitourinary: Negative for dysuria. Musculoskeletal: Negative for back pain. Neurological: Negative for headache 10-point ROS otherwise negative.  ____________________________________________   PHYSICAL EXAM:  VITAL SIGNS: ED Triage Vitals  Enc Vitals Group     BP 11/05/15 2036 178/74 mmHg     Pulse Rate 11/05/15 2036 66     Resp 11/05/15 2036 18     Temp 11/05/15 2036 97.7 F (36.5 C)     Temp Source 11/05/15 2036 Oral     SpO2 11/05/15 2036 98 %     Weight 11/05/15 2036 136 lb (61.689 kg)     Height 11/05/15 2036 5\' 1"  (1.549 m)     Head Cir --      Peak Flow --      Pain Score 11/05/15 2037 3     Pain Loc --      Pain Edu? --      Excl. in [place]? --     Constitutional: Alert and oriented. Well appearing and in no distress. Eyes: Normal exam ENT   Head: Normocephalic and atraumatic.   Mouth/Throat: Mucous membranes are moist. Cardiovascular: Normal rate, regular rhythm. No murmur Respiratory: Normal respiratory effort without tachypnea nor retractions. Breath sounds are clear.  No chest wall tenderness palpation Gastrointestinal: Soft, mild epigastric tenderness palpation without rebound or guarding. No distention. Musculoskeletal: Nontender with normal range of motion in all  extremities.  Neurologic:  Normal speech and language. No gross focal neurologic deficits  Skin:  Skin is warm, dry and intact.  Psychiatric: Mood and affect are normal.  ____________________________________________    INITIAL IMPRESSION / ASSESSMENT AND PLAN / ED COURSE  Pertinent labs & imaging results that were available during my care of the patient were reviewed by me and considered in my medical decision making (see chart for details).  Patient presents the emergency department with epigastric discomfort/burning, along with nausea. We will check labs including a lipase and troponin. We will treat patient's nausea with ODT Zofran and provided GI cocktail as the patient states this feels like her typical gastritis pains.  Labs have resulted with an elevated lipase of 238, normal troponin, normal chemistries including LFTs. We'll admit the patient to the hospital for further treatment her acute pancreatitis.  ____________________________________________   FINAL CLINICAL IMPRESSION(S) / ED DIAGNOSES  Epigastric pain Nausea Acute pancreatitis  Harvest Dark, MD 11/05/15 2213

## 2015-11-06 LAB — HEMOGLOBIN A1C: Hgb A1c MFr Bld: 5.9 % (ref 4.0–6.0)

## 2015-11-06 LAB — TSH: TSH: 3.211 u[IU]/mL (ref 0.350–4.500)

## 2015-11-06 MED ORDER — SODIUM CHLORIDE 0.9 % IV SOLN
INTRAVENOUS | Status: DC
  Administered 2015-11-06 – 2015-11-07 (×6): via INTRAVENOUS

## 2015-11-06 MED ORDER — ONDANSETRON HCL 4 MG PO TABS: 4.0000 mg | ORAL_TABLET | Freq: Four times a day (QID) | ORAL | Status: DC | PRN

## 2015-11-06 MED ORDER — ONDANSETRON HCL 4 MG/2ML IJ SOLN
4.0000 mg | Freq: Four times a day (QID) | INTRAMUSCULAR | Status: DC | PRN
  Administered 2015-11-06 – 2015-11-07 (×2): 4 mg via INTRAVENOUS
  Filled 2015-11-06 (×2): qty 2

## 2015-11-06 MED ORDER — ACETAMINOPHEN 650 MG RE SUPP: 650.0000 mg | Freq: Four times a day (QID) | RECTAL | Status: DC | PRN

## 2015-11-06 MED ORDER — MORPHINE SULFATE (PF) 2 MG/ML IV SOLN
1.0000 mg | INTRAVENOUS | Status: DC | PRN
Start: 2015-11-06 — End: 2015-11-08

## 2015-11-06 MED ORDER — CARVEDILOL 6.25 MG PO TABS
6.2500 mg | ORAL_TABLET | Freq: Two times a day (BID) | ORAL | Status: DC
  Administered 2015-11-06 – 2015-11-08 (×4): 6.25 mg via ORAL
  Filled 2015-11-06 (×5): qty 1

## 2015-11-06 MED ORDER — IPRATROPIUM-ALBUTEROL 0.5-2.5 (3) MG/3ML IN SOLN: 3.0000 mL | Freq: Four times a day (QID) | RESPIRATORY_TRACT | Status: DC | PRN

## 2015-11-06 MED ORDER — MOMETASONE FURO-FORMOTEROL FUM 200-5 MCG/ACT IN AERO
2.0000 | INHALATION_SPRAY | Freq: Two times a day (BID) | RESPIRATORY_TRACT | Status: DC
  Administered 2015-11-06 – 2015-11-08 (×5): 2 via RESPIRATORY_TRACT
  Filled 2015-11-06: qty 8.8

## 2015-11-06 MED ORDER — ACETAMINOPHEN 325 MG PO TABS: 650.0000 mg | ORAL_TABLET | Freq: Four times a day (QID) | ORAL | Status: DC | PRN

## 2015-11-06 MED ORDER — IPRATROPIUM-ALBUTEROL 20-100 MCG/ACT IN AERS: 1.0000 | INHALATION_SPRAY | Freq: Four times a day (QID) | RESPIRATORY_TRACT | Status: DC | PRN

## 2015-11-06 MED ORDER — SUCRALFATE 1 GM/10ML PO SUSP
1.0000 g | Freq: Four times a day (QID) | ORAL | Status: DC
  Administered 2015-11-06 – 2015-11-08 (×10): 1 g via ORAL
  Filled 2015-11-06 (×10): qty 10

## 2015-11-06 MED ORDER — LOSARTAN POTASSIUM 50 MG PO TABS: 100.0000 mg | ORAL_TABLET | Freq: Every day | ORAL | Status: DC

## 2015-11-06 MED ORDER — NYSTATIN 100000 UNIT/GM EX POWD
Freq: Two times a day (BID) | CUTANEOUS | Status: DC
  Administered 2015-11-06 – 2015-11-08 (×5): via TOPICAL
  Filled 2015-11-06: qty 15

## 2015-11-06 MED ORDER — DILTIAZEM HCL ER COATED BEADS 120 MG PO CP24
120.0000 mg | ORAL_CAPSULE | Freq: Every day | ORAL | Status: DC
  Administered 2015-11-06 – 2015-11-08 (×3): 120 mg via ORAL
  Filled 2015-11-06 (×3): qty 1

## 2015-11-06 MED ORDER — LOSARTAN POTASSIUM 50 MG PO TABS
100.0000 mg | ORAL_TABLET | Freq: Every day | ORAL | Status: DC
  Administered 2015-11-06 – 2015-11-08 (×3): 100 mg via ORAL
  Filled 2015-11-06 (×3): qty 2

## 2015-11-06 MED ORDER — DEXTROSE 5 % IV SOLN
1.0000 g | INTRAVENOUS | Status: DC
  Administered 2015-11-06 – 2015-11-07 (×2): 1 g via INTRAVENOUS
  Filled 2015-11-06 (×3): qty 10

## 2015-11-06 MED ORDER — DOCUSATE SODIUM 100 MG PO CAPS
100.0000 mg | ORAL_CAPSULE | Freq: Two times a day (BID) | ORAL | Status: DC
  Administered 2015-11-06 – 2015-11-08 (×5): 100 mg via ORAL
  Filled 2015-11-06 (×5): qty 1

## 2015-11-06 MED ORDER — PANTOPRAZOLE SODIUM 40 MG PO TBEC
40.0000 mg | DELAYED_RELEASE_TABLET | Freq: Every day | ORAL | Status: DC
  Administered 2015-11-06 – 2015-11-08 (×3): 40 mg via ORAL
  Filled 2015-11-06 (×3): qty 1

## 2015-11-06 MED ORDER — ENOXAPARIN SODIUM 40 MG/0.4ML ~~LOC~~ SOLN
40.0000 mg | SUBCUTANEOUS | Status: DC
  Administered 2015-11-06 – 2015-11-07 (×2): 40 mg via SUBCUTANEOUS
  Filled 2015-11-06 (×2): qty 0.4

## 2015-11-06 NOTE — Care Management (Addendum)
Admitted to this facility with the diagnosis of pancreatitis. Lives alone. Daughter is Sydell Axon, [phone]). Dr. Hoy Morn is listed as primary care physician. In office about a month ago. No falls. Good appetite when she feels good. No home health. No skilled facility. Takes care of all basic and instrumental activities of daily living herself, drives. Family will transport.  Shelbie Ammons RN MSN CCM Care Management [phone]

## 2015-11-06 NOTE — Care Management Important Message (Signed)
Important Message  Patient Details  Name: Jaime Berger MRN: [id number] Date of Birth: 16-Sep-1929   Medicare Important Message Given:  Yes    Shelbie Ammons, RN 11/06/2015, 11:36 AM

## 2015-11-06 NOTE — Progress Notes (Signed)
[place] [facility] [place] NAME: Jaime Berger    MR#:  [id number]  DATE OF BIRTH:  02-26-1930  SUBJECTIVE:  CHIEF COMPLAINT:   Chief Complaint  Patient presents with  . Nausea  . Abdominal Pain  Patient is 82 year old Caucasian female with past medical history significant for history of essential hypertension, heart murmur, has esophageal reflux disease, asthma, osteoporosis, hyperlipidemia who presents to the hospital with complaints of upper abdominal pain, radiating to the chest. On arrival to emergency room, patient received GI cocktail which improved her symptoms. Patient's labs revealed mildly elevated lipase levels and since patient was not able to tolerate oral intake. She was admitted to the hospital for further evaluation and therapy. Today she complains of upper normal pain, does not feel that she has improved significantly. Pain is described as achy in upper abdomen with no alleviating or relieving factors. No radiation  Review of Systems  Constitutional: Negative for fever, chills and weight loss.  HENT: Negative for congestion.   Eyes: Negative for blurred vision and double vision.  Respiratory: Negative for cough, sputum production, shortness of breath and wheezing.   Cardiovascular: Negative for chest pain, palpitations, orthopnea, leg swelling and PND.  Gastrointestinal: Positive for abdominal pain. Negative for nausea, vomiting, diarrhea, constipation and blood in stool.  Genitourinary: Negative for dysuria, urgency, frequency and hematuria.  Musculoskeletal: Negative for falls.  Neurological: Negative for dizziness, tremors, focal weakness and headaches.  Endo/Heme/Allergies: Does not bruise/bleed easily.  Psychiatric/Behavioral: Negative for depression. The patient does not have insomnia.     VITAL SIGNS: Blood pressure 183/63, pulse 62, temperature 98 F (36.7 C), temperature source Oral, resp. rate 19, height 5\' 1"   (1.549 m), weight 63.413 kg (139 lb 12.8 oz), SpO2 95 %.  PHYSICAL EXAMINATION:   GENERAL:  82 y.o.-year-old patient lying in the bed with no acute distress.  EYES: Pupils equal, round, reactive to light and accommodation. No scleral icterus. Extraocular muscles intact.  HEENT: Head atraumatic, normocephalic. Oropharynx and nasopharynx clear.  NECK:  Supple, no jugular venous distention. No thyroid enlargement, no tenderness.  LUNGS: Normal breath sounds bilaterally, no wheezing, rales,rhonchi or crepitation. No use of accessory muscles of respiration.  CARDIOVASCULAR: S1, S2 normal. No murmurs, rubs, or gallops.  ABDOMEN: Soft, uncomfortable, painful palpation of upper abdomen with some voluntary guarding but no rebound, nondistended. Bowel sounds present. No organomegaly or mass.  EXTREMITIES: No pedal edema, cyanosis, or clubbing.  NEUROLOGIC: Cranial nerves II through XII are intact. Muscle strength 5/5 in all extremities. Sensation intact. Gait not checked.  PSYCHIATRIC: The patient is alert and oriented x 3.  SKIN: No obvious rash, lesion, or ulcer.   ORDERS/RESULTS REVIEWED:   CBC  Recent Labs Lab 11/05/15 2039  WBC 9.3  HGB 14.2  HCT 42.7  PLT 220  MCV 96.1  MCH 32.0  MCHC 33.3  RDW 14.3   ------------------------------------------------------------------------------------------------------------------  Chemistries   Recent Labs Lab 11/05/15 2039  NA 137  K 4.0  CL 103  CO2 24  GLUCOSE 118*  BUN 18  CREATININE 0.78  CALCIUM 9.2  AST 31  ALT 26  ALKPHOS 48  BILITOT 0.6   ------------------------------------------------------------------------------------------------------------------ estimated creatinine clearance is 44.6 mL/min (by C-G formula based on Cr of 0.78). ------------------------------------------------------------------------------------------------------------------  Recent Labs  11/05/15 2039  TSH 3.211    Cardiac Enzymes  Recent  Labs Lab 11/05/15 2039  TROPONINI <0.03   ------------------------------------------------------------------------------------------------------------------ Invalid input(s): POCBNP ---------------------------------------------------------------------------------------------------------------  RADIOLOGY: No results found.  EKG:  Orders placed or performed in visit on 08/28/11  . EKG 12-Lead    ASSESSMENT AND PLAN:  Active Problems:   Pancreatitis #1. Upper abdominal pain due to acute pancreatitis versus acute gastritis, continue patient on clear liquid diet, PPI, Carafate, follow lipase level in the morning, follow patient clinically and advance by mouth depending on tolerance. Patient would benefit from gastric neurologist evaluation as outpatient. Get lipid panel checked to rule out hypertriglyceridemia #2. Hyperglycemia, hemoglobin A1c 5.9, no diabetes #3. Urinary tract infection, initiate patient on Rocephin intravenously, follow culture results #4 malignant essential hypertension, continue patient on Coreg, diltiazem, losartan, advance medications as needed  Management plans discussed with the patient, family and they are in agreement.   DRUG ALLERGIES:  Allergies  Allergen Reactions  . Lipitor [Atorvastatin] Other (See Comments)    Irregular heartbeat  . Lovastatin Other (See Comments)    Irregular heartbeat  . Doxycycline Nausea And Vomiting  . Sulfa Antibiotics Hives    And swelling  . Metformin And Related Palpitations    CODE STATUS:     Code Status Orders        Start     Ordered   11/06/15 0114  Full code   Continuous     11/06/15 0113    Code Status History    Date Active Date Inactive Code Status Order ID Comments User Context   This patient has a current code status but no historical code status.      TOTAL TIME TAKING CARE OF THIS PATIENT: 40 minutes.    Theodoro Grist M.D on 11/06/2015 at 2:42 PM  Between 7am to 6pm - Pager -  [phone]  After 6pm go to [email or web address] - password [email or web address] [facility]  Office  [phone]  CC: Primary care physician; Hortencia Pilar, MD

## 2015-11-06 NOTE — Progress Notes (Signed)
Initial Nutrition Assessment     INTERVENTION:  Monitor diet progression and intake   NUTRITION DIAGNOSIS:   Inadequate oral intake related to altered GI function as evidenced by per patient/family report.   GOAL:   Patient will meet greater than or equal to 90% of their needs    MONITOR:   PO intake, Diet advancement  REASON FOR ASSESSMENT:   Malnutrition Screening Tool    ASSESSMENT:   82 y/o female admitted with abdominal pain and found to have pancreatitis  Past Medical History  Diagnosis Date  . Family history of adverse reaction to anesthesia     nausea  . Complication of anesthesia     Pt reports during Colonoscopy at [facility] was "overdosed" on meds and died and was brought back  . Hypertension   . Heart murmur     "Yrs ago", no mention recently  . Arthritis     legs, hands, "everywhere"  . GERD (gastroesophageal reflux disease)   . Asthma   . Osteoporosis     back  . Prosthetic eye globe     right  . Vertigo   . Hyperlipidemia   . Liver enzyme elevation     Tolerated liquids today so far Pt reports appetite has been up and down prior to admission secondary to abdominal pain. Typically has 3 meals per day  Medications reviewed colace, nystatin, carafate, NS at 119ml/hr Labs reviewed glucose 118, lipase 238  Nutrition-Focused physical exam completed. Findings are WDL for fat depletion, muscle depletion, and edema.   Diet Order:  Diet clear liquid Room service appropriate?: Yes; Fluid consistency:: Thin  Skin:  Reviewed, no issues  Last BM:  5/4  Height:   Ht Readings from Last 1 Encounters:  11/06/15 5\' 1"  (1.549 m)    Weight: Pt reports wt of 153 pounds 6 months ago but per wt encounters not reflective of current wt.  Wt Readings from Last 1 Encounters:  11/06/15 139 lb 12.8 oz (63.413 kg)    Ideal Body Weight:     BMI:  Body mass index is 26.43 kg/(m^2).  Estimated Nutritional Needs:   Kcal:  [id number] kcals/d  Protein:   78-94 g/d  Fluid:  1.5-1.8 L/d  EDUCATION NEEDS:   No education needs identified at this time  Varvara Legault B. Zenia Resides, [place], [place] (pager) Weekend/On-Call pager [phone])

## 2015-11-06 NOTE — H&P (Signed)
Jaime Berger is an 82 y.o. female.   Chief Complaint: Abdominal pain HPI: The patient presents to the emergency department complaining of abdominal pain. She states that she ate a sausage biscuit is morning which she thought seriously upset her reflux. However, her abdomen continued to hurt and she had pain radiating and into her chest substernally that was worse when supine. In the emergency department laboratory evaluation revealed pancreatitis. The patient received a GI cocktail which improved her symptoms some but due to her elevated lipase levels and inability to tolerate oral intake emergency department staff called for admission.  Past Medical History  Diagnosis Date  . Family history of adverse reaction to anesthesia     nausea  . Complication of anesthesia     Pt reports during Colonoscopy at [facility] was "overdosed" on meds and died and was brought back  . Hypertension   . Heart murmur     "Yrs ago", no mention recently  . Arthritis     legs, hands, "everywhere"  . GERD (gastroesophageal reflux disease)   . Asthma   . Osteoporosis     back  . Prosthetic eye globe     right  . Vertigo   . Hyperlipidemia   . Liver enzyme elevation     Past Surgical History  Procedure Laterality Date  . Breast surgery Right     lumpectomy  . Cholecystectomy    . Appendectomy    . Abdominal hysterectomy    . Eye surgery Right 1975     prosthesis after melanoma  . Hand / finger lesion excision      "nodules"  . Esophagogastroduodenoscopy N/A 12/04/2014    Procedure: ESOPHAGOGASTRODUODENOSCOPY (EGD);  Surgeon: Lucilla Lame, MD;  Location: [place];  Service: Gastroenterology;  Laterality: N/A;  REFERRING DR Hortencia Pilar    History reviewed. No pertinent family history. Social History:  reports that she has never smoked. She does not have any smokeless tobacco history on file. She reports that she does not drink alcohol. Her drug history is not on file.  Allergies:  Allergies   Allergen Reactions  . Lipitor [Atorvastatin] Other (See Comments)    Irregular heartbeat  . Lovastatin Other (See Comments)    Irregular heartbeat  . Doxycycline Nausea And Vomiting  . Sulfa Antibiotics Hives    And swelling  . Metformin And Related Palpitations    Medications Prior to Admission  Medication Sig Dispense Refill  . albuterol (PROVENTIL HFA;VENTOLIN HFA) 108 (90 BASE) MCG/ACT inhaler Inhale into the lungs every 6 (six) hours as needed for wheezing or shortness of breath.    . budesonide-formoterol (SYMBICORT) 160-4.5 MCG/ACT inhaler Inhale 2 puffs into the lungs 2 (two) times daily. Usually only uses in AM    . carvedilol (COREG) 6.25 MG tablet Take 6.25 mg by mouth 2 (two) times daily with a meal.     . dexlansoprazole (DEXILANT) 60 MG capsule Take 60 mg by mouth at bedtime.     Marland Kitchen diltiazem (DILACOR XR) 120 MG 24 hr capsule Take 120 mg by mouth daily.    Marland Kitchen losartan (COZAAR) 100 MG tablet Take 100 mg by mouth daily.       Results for orders placed or performed during the hospital encounter of 11/05/15 (from the past 48 hour(s))  Lipase, blood     Status: Abnormal   Collection Time: 11/05/15  8:39 PM  Result Value Ref Range   Lipase 238 (H) 11 - 51 U/L  Comprehensive metabolic  panel     Status: Abnormal   Collection Time: 11/05/15  8:39 PM  Result Value Ref Range   Sodium 137 135 - 145 mmol/L   Potassium 4.0 3.5 - 5.1 mmol/L   Chloride 103 101 - 111 mmol/L   CO2 24 22 - 32 mmol/L   Glucose, Bld 118 (H) 65 - 99 mg/dL   BUN 18 6 - 20 mg/dL   Creatinine, Ser 0.78 0.44 - 1.00 mg/dL   Calcium 9.2 8.9 - 10.3 mg/dL   Total Protein 7.1 6.5 - 8.1 g/dL   Albumin 4.1 3.5 - 5.0 g/dL   AST 31 15 - 41 U/L   ALT 26 14 - 54 U/L   Alkaline Phosphatase 48 38 - 126 U/L   Total Bilirubin 0.6 0.3 - 1.2 mg/dL   GFR calc non Af Amer >60 >60 mL/min   GFR calc Af Amer >60 >60 mL/min    Comment: (NOTE) The eGFR has been calculated using the CKD EPI equation. This calculation has  not been validated in all clinical situations. eGFR's persistently <60 mL/min signify possible Chronic Kidney Disease.    Anion gap 10 5 - 15  CBC     Status: None   Collection Time: 11/05/15  8:39 PM  Result Value Ref Range   WBC 9.3 3.6 - 11.0 K/uL   RBC 4.44 3.80 - 5.20 MIL/uL   Hemoglobin 14.2 12.0 - 16.0 g/dL   HCT 42.7 35.0 - 47.0 %   MCV 96.1 80.0 - 100.0 fL   MCH 32.0 26.0 - 34.0 pg   MCHC 33.3 32.0 - 36.0 g/dL   RDW 14.3 11.5 - 14.5 %   Platelets 220 150 - 440 K/uL  Troponin I     Status: None   Collection Time: 11/05/15  8:39 PM  Result Value Ref Range   Troponin I <0.03 <0.031 ng/mL    Comment:        NO INDICATION OF MYOCARDIAL INJURY.   TSH     Status: None   Collection Time: 11/05/15  8:39 PM  Result Value Ref Range   TSH 3.211 0.350 - 4.500 uIU/mL  Urinalysis complete, with microscopic     Status: Abnormal   Collection Time: 11/05/15 10:05 PM  Result Value Ref Range   Color, Urine STRAW (A) YELLOW   APPearance HAZY (A) CLEAR   Glucose, UA NEGATIVE NEGATIVE mg/dL   Bilirubin Urine NEGATIVE NEGATIVE   Ketones, ur NEGATIVE NEGATIVE mg/dL   Specific Gravity, Urine 1.008 1.005 - 1.030   Hgb urine dipstick NEGATIVE NEGATIVE   pH 6.0 5.0 - 8.0   Protein, ur NEGATIVE NEGATIVE mg/dL   Nitrite NEGATIVE NEGATIVE   Leukocytes, UA 3+ (A) NEGATIVE   RBC / HPF 0-5 0 - 5 RBC/hpf   WBC, UA TOO NUMEROUS TO COUNT 0 - 5 WBC/hpf   Bacteria, UA RARE (A) NONE SEEN   Squamous Epithelial / LPF 0-5 (A) NONE SEEN   Mucous PRESENT    No results found.  Review of Systems  Constitutional: Negative for fever and chills.  HENT: Negative for sore throat and tinnitus.   Eyes: Negative for blurred vision and redness.  Respiratory: Negative for cough and shortness of breath.   Cardiovascular: Negative for chest pain, palpitations, orthopnea and PND.  Gastrointestinal: Positive for abdominal pain. Negative for nausea, vomiting and diarrhea.  Genitourinary: Positive for dysuria.  Negative for urgency and frequency.  Musculoskeletal: Negative for myalgias and joint pain.  Skin: Negative for  rash.       No lesions  Neurological: Negative for speech change, focal weakness and weakness.  Endo/Heme/Allergies: Does not bruise/bleed easily.       No temperature intolerance  Psychiatric/Behavioral: Negative for depression and suicidal ideas.    Blood pressure 167/55, pulse 50, temperature 97.7 F (36.5 C), temperature source Oral, resp. rate 16, height _0  (1.549 m), weight 61.689 kg (136 lb), SpO2 100 %. Physical Exam  Nursing note and vitals reviewed. Constitutional: She is oriented to person, place, and time. She appears well-developed and well-nourished. No distress.  HENT:  Head: Normocephalic and atraumatic.  Mouth/Throat: Oropharynx is clear and moist.  Eyes: Conjunctivae and EOM are normal. Pupils are equal, round, and reactive to light. No scleral icterus.  Neck: Normal range of motion. Neck supple. No JVD present. No tracheal deviation present. No thyromegaly present.  Cardiovascular: Normal rate, regular rhythm and normal heart sounds.  Exam reveals no gallop and no friction rub.   No murmur heard. Respiratory: Effort normal and breath sounds normal.  GI: Soft. Bowel sounds are normal. She exhibits no distension and no mass. There is tenderness. There is no rebound and no guarding.  Genitourinary:  Deferred  Musculoskeletal: Normal range of motion. She exhibits no edema.  Lymphadenopathy:    She has no cervical adenopathy.  Neurological: She is alert and oriented to person, place, and time. No cranial nerve deficit. She exhibits normal muscle tone.  Skin: Skin is warm and dry. No rash noted. No erythema.  Psychiatric: She has a normal mood and affect. Her behavior is normal. Judgment and thought content normal.     Assessment/Plan This is an 82 year old female admitted for pancreatitis. 1. Pancreatitis: Renal function is normal and the patient has no  electrolyte abnormalities nor any respiratory distress. We will manage her blood sugars and control pain as best as possible. I have placed her on a clear liquid diet. 2. Sterile pyuria: The patient endorses some vague urinary complaints. She denies outright dysuria or fever. I suspect she may have a yeast infection and have prescribed Diflucan. If urinary symptoms do not resolve obtain follow-up urinalysis and culture if needed or treat empirically. 3. Benign essential hypertension: Continue Coreg, valsartan and diltiazem 4. COPD: Controlled. Continue inhaled corticosteroid and beta agonist/anticholinergic as needed. 5. DVT prophylaxis: Lovenox 6. GI prophylaxis: Pantoprazole The patient is a full code. Time spent on admission orders and patient care approximate 45 minutes  Harrie Foreman, MD 11/06/2015, 3:45 AM

## 2015-11-06 NOTE — Evaluation (Signed)
Physical Therapy Evaluation Patient Details Name: XENIA GILGENBACH MRN: [id number] DOB: [birth year] Today's Date: 11/06/2015   History of Present Illness  82 y/o female presents to ER complaining of abdominal pain. Abdomen continued to hurt and she had pain radiating and into her chest substernally that was worse when supine. In the emergency department laboratory evaluation revealed pancreatitis. Otherwise pt is generally heathy and independent  Clinical Impression  Pt did well with all PT requests and showed no safety issues or hesitancy with ambulation, balance, stair negotiation or other tasks.  She showed functional strength and mobility and reports she feels confident about going home and being able to manage all her ADLs, etc.  Pt does not require further PT intervention.     Follow Up Recommendations No PT follow up    Equipment Recommendations       Recommendations for Other Services       Precautions / Restrictions Precautions Precautions: Fall Restrictions Weight Bearing Restrictions: No      Mobility  Bed Mobility Overal bed mobility: Independent             General bed mobility comments: Pt with no issues getting to EOB.  Transfers Overall transfer level: Independent Equipment used: None             General transfer comment: Pt is able to rise to standing w/o issue, maintains balance and reports being near her baseline.  Ambulation/Gait Ambulation/Gait assistance: Independent Ambulation Distance (Feet): 250 Feet Assistive device: None       General Gait Details: Pt is able to ambulate w/o safety issues and shows good confidence and speed.  She has no LOBs and generally reports being near her baseline.   Stairs Stairs: Yes Stairs assistance: Independent Stair Management: One rail Right Number of Stairs: 4 General stair comments: Pt is able to negotiate up/down 4 steps using reciprocal strategy with no safety issues or hesitation  Wheelchair  Mobility    Modified Rankin (Stroke Patients Only)       Balance Overall balance assessment: Independent                                           Pertinent Vitals/Pain Pain Assessment: 0-10 Pain Score: 3  Pain Location: abdomen    Home Living Family/patient expects to be discharged to:: Private residence Living Arrangements: Alone Available Help at Discharge: Family Type of Home: House Home Access: Stairs to enter Entrance Stairs-Rails: Can reach both Entrance Stairs-Number of Steps: 4          Prior Function Level of Independence: Independent         Comments: Pt drives, runs her errands and typically is able to all she needs w/o assist.  Pt does have family close by if/when she does need assist.     Hand Dominance        Extremity/Trunk Assessment   Upper Extremity Assessment: Overall WFL for tasks assessed           Lower Extremity Assessment: Overall WFL for tasks assessed         Communication   Communication: No difficulties  Cognition Arousal/Alertness: Awake/alert Behavior During Therapy: WFL for tasks assessed/performed Overall Cognitive Status: Within Functional Limits for tasks assessed                      General Comments  Exercises        Assessment/Plan    PT Assessment Patent does not need any further PT services  PT Diagnosis Difficulty walking   PT Problem List    PT Treatment Interventions     PT Goals (Current goals can be found in the Care Plan section) Acute Rehab PT Goals Patient Stated Goal: "Go home."    Frequency     Barriers to discharge        Co-evaluation               End of Session Equipment Utilized During Treatment: Gait belt Activity Tolerance: Patient tolerated treatment well Patient left: with bed alarm set;with call bell/phone within reach Nurse Communication: Mobility status         Time: [id number] PT Time Calculation (min) (ACUTE ONLY): 17  min   Charges:   PT Evaluation $PT Eval Low Complexity: 1 Procedure     PT G Codes:        Kreg Shropshire , DPT  11/06/2015, 1:26 PM

## 2015-11-07 LAB — LIPID PANEL
Cholesterol: 179 mg/dL (ref 0–200)
HDL: 44 mg/dL (ref >40)
LDL Cholesterol: 113 mg/dL — ABNORMAL HIGH (ref 0–99)
Total CHOL/HDL Ratio: 4.1 RATIO
Triglycerides: 109 mg/dL (ref <150)
VLDL: 22 mg/dL (ref 0–40)

## 2015-11-07 LAB — LIPASE, BLOOD: LIPASE: 23 U/L (ref 11–51)

## 2015-11-07 NOTE — Consult Note (Signed)
GI Inpatient Consult Note  Reason for Consult: Pancreatitis, abdominal pain   Attending Requesting Consult: Dr. Marcille Blanco  History of Present Illness: Jaime Berger is a 82 y.o. female with a significant past medical history of gastritis presented to the emergency department after having upper quadrant abdominal pain.  She reports she had recently been on a trip to the [place], had eaten in a restaurant, which is unusual for her.  She avoids eating out due to her gastritis.  She reports she thought her epigastric pain was related to her gastritis.  At the emergency department she was found to have a elevated lipase of 238.  She reports she has never had pancreatitis before, does not drink alcohol, has had a previous cholecystectomy, triglycerides within normal limits.  She reports she is feeling much better today, was able to tolerate clear liquids at breakfast and lunch.  She reports her abdominal pain is much improved, and is currently minimal.  She has not had a bowel movement since admission.  She reports that she is followed by Dr. Allen Norris who treats her with Dexilant for her gastritis, she reports her heartburn and acid reflux are well controlled on this.  She had an upper endoscopy completed in June 2016 with Dr. Allen Norris for the indication of heartburn.  Impression: Z line irregular, at the gastroesophageal junction.  Gastritis.  Normal examined duodenum.  We do not have a copy of the pathology.  She has no other GI complaints at this time.  Past Medical History:  Past Medical History  Diagnosis Date  . Family history of adverse reaction to anesthesia     nausea  . Complication of anesthesia     Pt reports during Colonoscopy at [facility] was "overdosed" on meds and died and was brought back  . Hypertension   . Heart murmur     "Yrs ago", no mention recently  . Arthritis     legs, hands, "everywhere"  . GERD (gastroesophageal reflux disease)   . Asthma   . Osteoporosis     back  .  Prosthetic eye globe     right  . Vertigo   . Hyperlipidemia   . Liver enzyme elevation     Problem List: Patient Active Problem List   Diagnosis Date Noted  . Pancreatitis 11/05/2015  . Blind one eye 01/01/2015  . Drug intolerance 12/03/2014  . HLD (hyperlipidemia) 03/06/2012  . Pseudoaphakia 02/17/2012  . Airway hyperreactivity 01/03/2011  . Controlled diabetes mellitus type II without complication ([place]) [id number]  . Acid reflux 01/03/2011  . Benign essential HTN 01/03/2011  . 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 OP (osteoporosis) 01/03/2011  . Syncope and collapse 01/03/2011    Past Surgical History: Past Surgical History  Procedure Laterality Date  . Breast surgery Right     lumpectomy  . Cholecystectomy    . Appendectomy    . Abdominal hysterectomy    . Eye surgery Right 1975     prosthesis after melanoma  . Hand / finger lesion excision      "nodules"  . Esophagogastroduodenoscopy N/A 12/04/2014    Procedure: ESOPHAGOGASTRODUODENOSCOPY (EGD);  Surgeon: Lucilla Lame, MD;  Location: [place];  Service: Gastroenterology;  Laterality: N/A;  REFERRING DR Hortencia Pilar    Allergies: Allergies  Allergen Reactions  . Lipitor [Atorvastatin] Other (See Comments)    Irregular heartbeat  . Lovastatin Other (See Comments)    Irregular heartbeat  . Doxycycline Nausea And Vomiting  . Sulfa Antibiotics Hives  And swelling  . Metformin And Related Palpitations    Home Medications: Prescriptions prior to admission  Medication Sig Dispense Refill Last Dose  . albuterol (PROVENTIL HFA;VENTOLIN HFA) 108 (90 BASE) MCG/ACT inhaler Inhale into the lungs every 6 (six) hours as needed for wheezing or shortness of breath.   PRN at PRN  . budesonide-formoterol (SYMBICORT) 160-4.5 MCG/ACT inhaler Inhale 2 puffs into the lungs 2 (two) times daily. Usually only uses in AM   11/05/2015 at Unknown time  . carvedilol (COREG) 6.25 MG tablet Take 6.25 mg by mouth 2 (two)  times daily with a meal.    11/05/2015 at 0800  . dexlansoprazole (DEXILANT) 60 MG capsule Take 60 mg by mouth at bedtime.    11/04/2015 at Unknown time  . diltiazem (DILACOR XR) 120 MG 24 hr capsule Take 120 mg by mouth daily.   11/05/2015 at Unknown time  . losartan (COZAAR) 100 MG tablet Take 100 mg by mouth daily.    11/05/2015 at Unknown time   Home medication reconciliation was completed with the patient.   Scheduled Inpatient Medications:   . carvedilol  6.25 mg Oral BID WC  . cefTRIAXone (ROCEPHIN)  IV  1 g Intravenous Q24H  . diltiazem  120 mg Oral Daily  . docusate sodium  100 mg Oral BID  . enoxaparin (LOVENOX) injection  40 mg Subcutaneous Q24H  . losartan  100 mg Oral Daily  . mometasone-formoterol  2 puff Inhalation BID  . nystatin   Topical BID  . pantoprazole  40 mg Oral Daily  . sucralfate  1 g Oral Q6H    Continuous Inpatient Infusions:   . sodium chloride 100 mL/hr at 11/07/15 0656    PRN Inpatient Medications:  acetaminophen **OR** acetaminophen, ipratropium-albuterol, morphine injection, ondansetron **OR** ondansetron (ZOFRAN) IV  Family History: family history is not on file.   Social History:   reports that she has never smoked. She does not have any smokeless tobacco history on file. She reports that she does not drink alcohol.    Review of Systems: Constitutional: Weight is stable.  Eyes: Blind in one eye ENT: No oral lesions, sore throat.  GI: see HPI.  Heme/Lymph: No easy bruising.  CV: No chest pain.  GU: No hematuria.  Integumentary: No rashes.  Neuro: No headaches.  Psych: No depression/anxiety.  Endocrine: No heat/cold intolerance.  Allergic/Immunologic: No urticaria.  Resp: No cough, SOB.  Musculoskeletal: No joint swelling.    Physical Examination: BP 158/59 mmHg  Pulse 53  Temp(Src) 98.7 F (37.1 C) (Oral)  Resp 18  Ht 5\' 1"  (1.549 m)  Wt 63.413 kg (139 lb 12.8 oz)  BMI 26.43 kg/m2  SpO2 92% Gen: NAD, alert and oriented x 4, very  pleasant HEENT: blind in one eye Neck: supple, no JVD or thyromegaly Chest: CTA bilaterally, no wheezes, crackles, or other adventitious sounds CV: RRR, no m/g/c/r Abd: soft, NT, ND, +BS in all four quadrants; no HSM, guarding, ridigity, or rebound tenderness Ext: no edema, well perfused with 2+ pulses, Skin: no rash or lesions noted Lymph: no LAD  Data: Lab Results  Component Value Date   WBC 9.3 11/05/2015   HGB 14.2 11/05/2015   HCT 42.7 11/05/2015   MCV 96.1 11/05/2015   PLT 220 11/05/2015    Recent Labs Lab 11/05/15 2039  HGB 14.2   Lab Results  Component Value Date   NA 137 11/05/2015   K 4.0 11/05/2015   CL 103 11/05/2015   CO2 24  11/05/2015   BUN 18 11/05/2015   CREATININE 0.78 11/05/2015   Lab Results  Component Value Date   ALT 26 11/05/2015   AST 31 11/05/2015   ALKPHOS 48 11/05/2015   BILITOT 0.6 11/05/2015   No results for input(s): APTT, INR, PTT in the last 168 hours.  Assessment/Plan: Ms. Bartman is a 82 y.o. female with resolving upper abdominal pain, likely pancreatitis.  Lipase on admission was 238, currently 23.  Triglycerides within normal limits, no alcohol use.  No previous episodes of pancreatitis.  Is currently being treated with Protonix 40 mg, Carafate liquid 4 times a day, Rocephin for concurrent UTI.  Full liquid diet has been ordered.   Recommendations: Patient has been discussed with Dr. Vira Agar, pending further GI recommendations at this time.Her abdominal pain is resolving and labs are improving.  We recommend continuing to advance diet as tolerated.  We recommend that she follow-up with Dr. Allen Norris as an outpatient.   Thank you for the consult. Please call with questions or concerns.  Salvadore Farber, PA-C  I personally performed these services.

## 2015-11-07 NOTE — Progress Notes (Signed)
[facility] [place] NAME: Jaime Berger    MR#:  [id number]  DATE OF BIRTH:  [birth year]  SUBJECTIVE:  CHIEF COMPLAINT:   Chief Complaint  Patient presents with  . Nausea  . Abdominal Pain  Patient is 82 year old Caucasian female with past medical history significant for history of essential hypertension, heart murmur, has esophageal reflux disease, asthma, osteoporosis, hyperlipidemia who presents to the hospital with complaints of upper abdominal pain, radiating to the chest. On arrival to emergency room, patient received GI cocktail which improved her symptoms. Patient's labs revealed mildly elevated lipase levels and since patient was not able to tolerate oral intake. She was admitted to the hospital for further evaluation and therapy. Today she complains of upper normal pain, does not feel that she has improved significantly. Pain is described as achy in upper abdomen with no alleviating or relieving factors. No radiation  Today patient is resting comfortably and reporting epigastric abdominal pain. Denies any nausea and tolerating clear liquid diet. Requesting to advance her diet  Review of Systems  Constitutional: Negative for fever, chills and weight loss.  HENT: Negative for congestion.   Eyes: Negative for blurred vision and double vision.  Respiratory: Negative for cough, sputum production, shortness of breath and wheezing.   Cardiovascular: Negative for chest pain, palpitations, orthopnea, leg swelling and PND.  Gastrointestinal: Positive for abdominal pain. Negative for nausea, vomiting, diarrhea, constipation and blood in stool.  Genitourinary: Negative for dysuria, urgency, frequency and hematuria.  Musculoskeletal: Negative for falls.  Neurological: Negative for dizziness, tremors, focal weakness and headaches.  Endo/Heme/Allergies: Does not bruise/bleed easily.  Psychiatric/Behavioral: Negative for depression. The patient  does not have insomnia.     VITAL SIGNS: Blood pressure 151/56, pulse 56, temperature 98.8 F (37.1 C), temperature source Oral, resp. rate 18, height 5\' 1"  (1.549 m), weight 63.413 kg (139 lb 12.8 oz), SpO2 96 %.  PHYSICAL EXAMINATION:   GENERAL:  82 y.o.-year-old patient lying in the bed with no acute distress.  EYES: Pupils equal, round, reactive to light and accommodation. No scleral icterus. Extraocular muscles intact.  HEENT: Head atraumatic, normocephalic. Oropharynx and nasopharynx clear.  NECK:  Supple, no jugular venous distention. No thyroid enlargement, no tenderness.  LUNGS: Normal breath sounds bilaterally, no wheezing, rales,rhonchi or crepitation. No use of accessory muscles of respiration.  CARDIOVASCULAR: S1, S2 normal. No murmurs, rubs, or gallops.  ABDOMEN: Soft, uncomfortable, painful palpation of upper abdomen with some voluntary guarding but no rebound, nondistended. Bowel sounds present. No organomegaly or mass.  EXTREMITIES: No pedal edema, cyanosis, or clubbing.  NEUROLOGIC: Cranial nerves II through XII are intact. Muscle strength 5/5 in all extremities. Sensation intact. Gait not checked.  PSYCHIATRIC: The patient is alert and oriented x 3.  SKIN: No obvious rash, lesion, or ulcer.   ORDERS/RESULTS REVIEWED:   CBC  Recent Labs Lab 11/05/15 2039  WBC 9.3  HGB 14.2  HCT 42.7  PLT 220  MCV 96.1  MCH 32.0  MCHC 33.3  RDW 14.3   ------------------------------------------------------------------------------------------------------------------  Chemistries   Recent Labs Lab 11/05/15 2039  NA 137  K 4.0  CL 103  CO2 24  GLUCOSE 118*  BUN 18  CREATININE 0.78  CALCIUM 9.2  AST 31  ALT 26  ALKPHOS 48  BILITOT 0.6   ------------------------------------------------------------------------------------------------------------------ estimated creatinine clearance is 44.6 mL/min (by C-G formula based on Cr of  0.78). ------------------------------------------------------------------------------------------------------------------  Recent Labs  11/05/15 2039  TSH 3.211  Cardiac Enzymes  Recent Labs Lab 11/05/15 2039  TROPONINI <0.03   ------------------------------------------------------------------------------------------------------------------ Invalid input(s): POCBNP ---------------------------------------------------------------------------------------------------------------  RADIOLOGY: No results found.  EKG:  Orders placed or performed in visit on 08/28/11  . EKG 12-Lead    ASSESSMENT AND PLAN:  Active Problems:   Pancreatitis #1. Upper abdominal pain due to acute gastritis,Versus acute pancreatitis Lipase is back to normal, unlikely acute pancreatitis Clinically feeling better Advance diet as tolerated, start her on full liquid diet and advance, continue PPI, Carafate, follow lipase level in the morning, follow patient clinically and advance by mouth depending on tolerance. Patient would benefit from gastric neurologist evaluation as outpatient.  Triglycerides at 109  #2. Hyperglycemia, hemoglobin A1c 5.9, no diabetes #3. Urinary tract infection, initiated patient on Rocephin intravenously, urine cultures with no growth so far #4 malignant essential hypertension, continue patient on Coreg, diltiazem, losartan, advance medications as needed  Management plans discussed with the patient, family and they are in agreement.   DRUG ALLERGIES:  Allergies  Allergen Reactions  . Lipitor [Atorvastatin] Other (See Comments)    Irregular heartbeat  . Lovastatin Other (See Comments)    Irregular heartbeat  . Doxycycline Nausea And Vomiting  . Sulfa Antibiotics Hives    And swelling  . Metformin And Related Palpitations    CODE STATUS:     Code Status Orders        Start     Ordered   11/06/15 0114  Full code   Continuous     11/06/15 0113    Code Status  History    Date Active Date Inactive Code Status Order ID Comments User Context   This patient has a current code status but no historical code status.      TOTAL TIME TAKING CARE OF THIS PATIENT:35 minutes.    Nicholes Mango M.D on 11/07/2015 at 10:59 AM  Between 7am to 6pm - Pager - [phone]  After 6pm go to [email or web address] - password [email or web address] [facility]  Office  [phone]  CC: Primary care physician; Hortencia Pilar, MD

## 2015-11-07 NOTE — Progress Notes (Signed)
Ambulate with patient outside the room, gait steady, patient tolerated .

## 2015-11-07 NOTE — Consult Note (Signed)
Given her sudden onset and quick resolution of abd pain and elevated lipase with quick return to normal she likely passed a small stone causing brief spell of self limited pancreatitis.  Will let her eat supper tonight and if she does well can go home tomorrow.

## 2015-11-07 NOTE — Progress Notes (Signed)
Bedside report received, patient resting in the bed at this time. Denies any pain or discomfort , vss, mood calm  Will continue t monitor

## 2015-11-08 LAB — URINE CULTURE

## 2015-11-08 LAB — CREATININE, SERUM
Creatinine, Ser: 0.71 mg/dL (ref 0.44–1.00)
GFR calc Af Amer: >60 mL/min (ref >60)
GFR calc non Af Amer: >60 mL/min (ref >60)

## 2015-11-08 MED ORDER — CARVEDILOL 12.5 MG PO TABS: 12.5000 mg | ORAL_TABLET | Freq: Two times a day (BID) | ORAL | Status: DC

## 2015-11-08 MED ORDER — NYSTATIN 100000 UNIT/GM EX POWD: Freq: Two times a day (BID) | CUTANEOUS | Status: DC

## 2015-11-08 MED ORDER — ACETAMINOPHEN 325 MG PO TABS: 650.0000 mg | ORAL_TABLET | Freq: Four times a day (QID) | ORAL | Status: DC | PRN

## 2015-11-08 MED ORDER — DOCUSATE SODIUM 100 MG PO CAPS: 100.0000 mg | ORAL_CAPSULE | Freq: Two times a day (BID) | ORAL | Status: DC | PRN

## 2015-11-08 MED ORDER — HYDRALAZINE HCL 20 MG/ML IJ SOLN
5.0000 mg | Freq: Once | INTRAMUSCULAR | Status: AC
  Administered 2015-11-08: 5 mg via INTRAVENOUS
  Filled 2015-11-08: qty 1

## 2015-11-08 NOTE — Consult Note (Signed)
GI Inpatient Follow-up Note  Patient Identification: Jaime Berger is a 82 y.o. female with resolving pancreatitis  Subjective: Patient is doing well today.  She tolerated advancing diet.  She denies any further abdominal pain.  Scheduled Inpatient Medications:  . carvedilol  6.25 mg Oral BID WC  . cefTRIAXone (ROCEPHIN)  IV  1 g Intravenous Q24H  . diltiazem  120 mg Oral Daily  . docusate sodium  100 mg Oral BID  . enoxaparin (LOVENOX) injection  40 mg Subcutaneous Q24H  . hydrALAZINE  5 mg Intravenous Once  . losartan  100 mg Oral Daily  . mometasone-formoterol  2 puff Inhalation BID  . nystatin   Topical BID  . pantoprazole  40 mg Oral Daily  . sucralfate  1 g Oral Q6H    Continuous Inpatient Infusions:   . sodium chloride 100 mL/hr at 11/07/15 1454    PRN Inpatient Medications:  acetaminophen **OR** acetaminophen, ipratropium-albuterol, morphine injection, ondansetron **OR** ondansetron (ZOFRAN) IV  Review of Systems: Constitutional: Weight is stable.  Eyes: Blind in right eye ENT: No oral lesions, sore throat.  GI: see HPI.  Heme/Lymph: No easy bruising.  CV: No chest pain.  GU: No hematuria.  Integumentary: No rashes.  Neuro: No headaches.  Psych: No depression/anxiety.  Endocrine: No heat/cold intolerance.  Allergic/Immunologic: No urticaria.  Resp: No cough, SOB.  Musculoskeletal: No joint swelling.    Physical Examination: BP 173/54 mmHg  Pulse 61  Temp(Src) 98.2 F (36.8 C) (Oral)  Resp 18  Ht 5\' 1"  (1.549 m)  Wt 60.918 kg (134 lb 4.8 oz)  BMI 25.39 kg/m2  SpO2 95% Gen: NAD, alert and oriented x 4 HEENT: Blind in right eye Neck: supple, no JVD or thyromegaly Chest: CTA bilaterally, no wheezes, crackles, or other adventitious sounds CV: RRR, no m/g/c/r Abd: soft, NT, ND, +BS in all four quadrants; no HSM, guarding, ridigity, or rebound tenderness Ext: no edema, well perfused with 2+ pulses, Skin: no rash or lesions noted Lymph: no  LAD  Data: Lab Results  Component Value Date   WBC 9.3 11/05/2015   HGB 14.2 11/05/2015   HCT 42.7 11/05/2015   MCV 96.1 11/05/2015   PLT 220 11/05/2015    Recent Labs Lab 11/05/15 2039  HGB 14.2   Lab Results  Component Value Date   NA 137 11/05/2015   K 4.0 11/05/2015   CL 103 11/05/2015   CO2 24 11/05/2015   BUN 18 11/05/2015   CREATININE 0.71 11/08/2015   Lab Results  Component Value Date   ALT 26 11/05/2015   AST 31 11/05/2015   ALKPHOS 48 11/05/2015   BILITOT 0.6 11/05/2015   No results for input(s): APTT, INR, PTT in the last 168 hours.  Assessment/Plan: Jaime Berger is a 82 y.o. female with resolving pancreatitis, no further abdominal pain.  Tolerated advanced diet.  Being discharged home today.    Recommendations: She wishes to follow up with Dr. Allen Norris on discharge.  Thank you for the consult. Please call with questions or concerns.  Salvadore Farber, PA-C  I personally performed these services.

## 2015-11-08 NOTE — Progress Notes (Signed)
Discharge instructions explained to pt and pts family/ verbalized an understanding/ iv removed/ rx and home mediations returned to pt/ will transport off unit via wheelchair.

## 2015-11-08 NOTE — Progress Notes (Signed)
Dr. Margaretmary Eddy made aware of improved BP/ ok to discharge home.

## 2015-11-08 NOTE — Discharge Instructions (Signed)
Activity as tolerated Diet low-salt Follow-up with primary care physician in a week Follow-up with gastroenterology in a month as needed

## 2015-11-08 NOTE — Discharge Summary (Signed)
[facility] [place] NAME: Jaime Berger    MR#:  [id number]  DATE OF BIRTH:  01-02-1930  DATE OF ADMISSION:  11/05/2015 ADMITTING PHYSICIAN: Harrie Foreman, MD  DATE OF DISCHARGE: 11/08/15 PRIMARY CARE PHYSICIAN: Hortencia Pilar, MD    ADMISSION DIAGNOSIS:  Idiopathic acute pancreatitis, unspecified complication status [id number]  DISCHARGE DIAGNOSIS:  Active Problems:   Pancreatitis   SECONDARY DIAGNOSIS:   Past Medical History  Diagnosis Date  . Family history of adverse reaction to anesthesia     nausea  . Complication of anesthesia     Pt reports during Colonoscopy at [facility] was "overdosed" on meds and died and was brought back  . Hypertension   . Heart murmur     "Yrs ago", no mention recently  . Arthritis     legs, hands, "everywhere"  . GERD (gastroesophageal reflux disease)   . Asthma   . Osteoporosis     back  . Prosthetic eye globe     right  . Vertigo   . Hyperlipidemia   . Liver enzyme elevation     HOSPITAL COURSE:  Pancreatitis #1. Upper abdominal pain due to  acute pancreatitis Lipase is back to normal, Likely from transient acute pancreatitisProbably secondary to passing a small stone Clinically feeling better, advanced diet as tolerated and okay to discharge patient from GI standpointTriglycerides at 109  #2. Hyperglycemia, hemoglobin A1c 5.9, no diabetes #3. Urinary tract infection, initiated patient on Rocephin intravenously, urine cultures withinsignificant growth will discontinue IV antibiotics #4 malignant essential hypertension, continue patient on Coreg, diltiazem, losartan, advance medications as needed  Management plans discussed with the patient, family and they are in agreement.   DISCHARGE CONDITIONS:    Fair CONSULTS OBTAINED:  Treatment Team:  Manya Silvas, MD Lucilla Lame, MD   PROCEDURES none   DRUG ALLERGIES:   Allergies  Allergen Reactions  . Lipitor  [Atorvastatin] Other (See Comments)    Irregular heartbeat  . Lovastatin Other (See Comments)    Irregular heartbeat  . Doxycycline Nausea And Vomiting  . Sulfa Antibiotics Hives    And swelling  . Metformin And Related Palpitations    DISCHARGE MEDICATIONS:   Current Discharge Medication List    START taking these medications   Details  acetaminophen (TYLENOL) 325 MG tablet Take 2 tablets (650 mg total) by mouth every 6 (six) hours as needed for mild pain (or Fever >/= 101).    docusate sodium (COLACE) 100 MG capsule Take 1 capsule (100 mg total) by mouth 2 (two) times daily as needed for mild constipation. Qty: 10 capsule, Refills: 0    nystatin (NYSTATIN) powder Apply topically 2 (two) times daily. Qty: 15 g, Refills: 0      CONTINUE these medications which have CHANGED   Details  carvedilol (COREG) 12.5 MG tablet Take 1 tablet (12.5 mg total) by mouth 2 (two) times daily with a meal. Qty: 60 tablet, Refills: 0      CONTINUE these medications which have NOT CHANGED   Details  albuterol (PROVENTIL HFA;VENTOLIN HFA) 108 (90 BASE) MCG/ACT inhaler Inhale into the lungs every 6 (six) hours as needed for wheezing or shortness of breath.    budesonide-formoterol (SYMBICORT) 160-4.5 MCG/ACT inhaler Inhale 2 puffs into the lungs 2 (two) times daily. Usually only uses in AM    dexlansoprazole (DEXILANT) 60 MG capsule Take 60 mg by mouth at bedtime.     diltiazem (DILACOR XR) 120 MG 24  hr capsule Take 120 mg by mouth daily.    losartan (COZAAR) 100 MG tablet Take 100 mg by mouth daily.          DISCHARGE INSTRUCTIONS:   Activity as tolerated Diet low-salt Follow-up with primary care physician in a week Follow-up with gastroenterology in a month as needed  DIET:  Low-salt  DISCHARGE CONDITION:  Fair  ACTIVITY:  Activity as tolerated  OXYGEN:  Home Oxygen: No.   Oxygen Delivery: room air  DISCHARGE LOCATION:  home   If you experience worsening of your  admission symptoms, develop shortness of breath, life threatening emergency, suicidal or homicidal thoughts you must seek medical attention immediately by calling 911 or calling your MD immediately  if symptoms less severe.  You Must read complete instructions/literature along with all the possible adverse reactions/side effects for all the Medicines you take and that have been prescribed to you. Take any new Medicines after you have completely understood and accpet all the possible adverse reactions/side effects.   Please note  You were cared for by a hospitalist during your hospital stay. If you have any questions about your discharge medications or the care you received while you were in the hospital after you are discharged, you can call the unit and asked to speak with the hospitalist on call if the hospitalist that took care of you is not available. Once you are discharged, your primary care physician will handle any further medical issues. Please note that NO REFILLS for any discharge medications will be authorized once you are discharged, as it is imperative that you return to your primary care physician (or establish a relationship with a primary care physician if you do not have one) for your aftercare needs so that they can reassess your need for medications and monitor your lab values.     Today  Chief Complaint  Patient presents with  . Nausea  . Abdominal Pain   Patient is resting comfortably. Denies any abdominal pain. Tolerating advanced diet. Wants to go home. Daughter at bedside  ROS:  CONSTITUTIONAL: Denies fevers, chills. Denies any fatigue, weakness.  EYES: Denies blurry vision, double vision, eye pain. EARS, NOSE, THROAT: Denies tinnitus, ear pain, hearing loss. RESPIRATORY: Denies cough, wheeze, shortness of breath.  CARDIOVASCULAR: Denies chest pain, palpitations, edema.  GASTROINTESTINAL: Denies nausea, vomiting, diarrhea, abdominal pain. Denies bright red blood per  rectum. GENITOURINARY: Denies dysuria, hematuria. ENDOCRINE: Denies nocturia or thyroid problems. HEMATOLOGIC AND LYMPHATIC: Denies easy bruising or bleeding. SKIN: Denies rash or lesion. MUSCULOSKELETAL: Denies pain in neck, back, shoulder, knees, hips or arthritic symptoms.  NEUROLOGIC: Denies paralysis, paresthesias.  PSYCHIATRIC: Denies anxiety or depressive symptoms.   VITAL SIGNS:  Blood pressure 140/49, pulse 61, temperature 98.2 F (36.8 C), temperature source Oral, resp. rate 18, height 5\' 1"  (1.549 m), weight 60.918 kg (134 lb 4.8 oz), SpO2 95 %.  I/O:    Intake/Output Summary (Last 24 hours) at 11/08/15 1328 Last data filed at 11/08/15 0900  Gross per 24 hour  Intake   1760 ml  Output   2220 ml  Net   -460 ml    PHYSICAL EXAMINATION:  GENERAL:  82 y.o.-year-old patient lying in the bed with no acute distress.  EYES: Pupils equal, round, reactive to light and accommodation. No scleral icterus. Extraocular muscles intact.  HEENT: Head atraumatic, normocephalic. Oropharynx and nasopharynx clear.  NECK:  Supple, no jugular venous distention. No thyroid enlargement, no tenderness.  LUNGS: Normal breath sounds bilaterally, no wheezing,  rales,rhonchi or crepitation. No use of accessory muscles of respiration.  CARDIOVASCULAR: S1, S2 normal. No murmurs, rubs, or gallops.  ABDOMEN: Soft, non-tender, non-distended. Bowel sounds present. No organomegaly or mass.  EXTREMITIES: No pedal edema, cyanosis, or clubbing.  NEUROLOGIC: Cranial nerves II through XII are intact. Muscle strength 5/5 in all extremities. Sensation intact. Gait not checked.  PSYCHIATRIC: The patient is alert and oriented x 3.  SKIN: No obvious rash, lesion, or ulcer.   DATA REVIEW:   CBC  Recent Labs Lab 11/05/15 2039  WBC 9.3  HGB 14.2  HCT 42.7  PLT 220    Chemistries   Recent Labs Lab 11/05/15 2039 11/08/15 0522  NA 137  --   K 4.0  --   CL 103  --   CO2 24  --   GLUCOSE 118*  --    BUN 18  --   CREATININE 0.78 0.71  CALCIUM 9.2  --   AST 31  --   ALT 26  --   ALKPHOS 48  --   BILITOT 0.6  --     Cardiac Enzymes  Recent Labs Lab 11/05/15 2039  TROPONINI <0.03    Microbiology Results  Results for orders placed or performed during the hospital encounter of 11/05/15  Urine culture     Status: Abnormal   Collection Time: 11/06/15  5:17 PM  Result Value Ref Range Status   Specimen Description URINE, CLEAN CATCH  Final   Special Requests NONE  Final   Culture 3,000 COLONIES/mL INSIGNIFICANT GROWTH (A)  Final   Report Status 11/08/2015 FINAL  Final    RADIOLOGY:  No results found.  EKG:   Orders placed or performed in visit on 08/28/11  . EKG 12-Lead      Management plans discussed with the patient, family and they are in agreement.  CODE STATUS:     Code Status Orders        Start     Ordered   11/06/15 0114  Full code   Continuous     11/06/15 0113    Code Status History    Date Active Date Inactive Code Status Order ID Comments User Context   This patient has a current code status but no historical code status.      TOTAL TIME TAKING CARE OF THIS PATIENT: 45 minutes.    @[facility] @  on 11/08/2015 at 1:28 PM  Between 7am to 6pm - Pager - [phone]  After 6pm go to [email or web address] - password [email or web address] [facility]  Office  [phone]  CC: Primary care physician; Hortencia Pilar, MD

## 2015-11-13 ENCOUNTER — Telehealth: Payer: Self-pay

## 2015-11-13 NOTE — Telephone Encounter (Signed)
Patient's daughter came to the office stating that her mother had ran out of Dexilant 60 MG. She stated that she had placed the order from their mail order pahrmacy today. However, it would take about 10 days to receive them. I told her that we were able to provide her with two boxes that contained 5 pills per bottle. Patient has a follow up appointment with Dr. Allen Norris on 12/14/2015 at 2:00 PM.  Lot# [id number] Expiration Date# 07/2018

## 2015-12-02 ENCOUNTER — Ambulatory Visit
Admission: RE | Admit: 2015-12-02 | Discharge: 2015-12-02 | Disposition: A | Discharge status: Home / Self Care | Payer: Medicare Other | Source: Ambulatory Visit | Attending: Family Medicine | Admitting: Family Medicine

## 2015-12-02 ENCOUNTER — Other Ambulatory Visit: Payer: Self-pay | Admitting: Family Medicine

## 2015-12-02 DIAGNOSIS — M4856XA Collapsed vertebra, not elsewhere classified, lumbar region, initial encounter for fracture: Secondary | ICD-10-CM | POA: Diagnosis not present

## 2015-12-02 DIAGNOSIS — R109 Unspecified abdominal pain: Secondary | ICD-10-CM | POA: Diagnosis present

## 2015-12-02 DIAGNOSIS — R911 Solitary pulmonary nodule: Secondary | ICD-10-CM | POA: Diagnosis not present

## 2015-12-02 DIAGNOSIS — M4850XA Collapsed vertebra, not elsewhere classified, site unspecified, initial encounter for fracture: Secondary | ICD-10-CM | POA: Insufficient documentation

## 2015-12-02 MED ORDER — IOPAMIDOL (ISOVUE-300) INJECTION 61%
100.0000 mL | Freq: Once | INTRAVENOUS | Status: AC | PRN
  Administered 2015-12-02: 100 mL via INTRAVENOUS

## 2015-12-14 ENCOUNTER — Encounter: Payer: Self-pay | Admitting: Gastroenterology

## 2015-12-14 ENCOUNTER — Ambulatory Visit (INDEPENDENT_AMBULATORY_CARE_PROVIDER_SITE_OTHER): Payer: Medicare Other | Admitting: Gastroenterology

## 2015-12-14 ENCOUNTER — Other Ambulatory Visit: Payer: Self-pay

## 2015-12-14 VITALS — BP 164/71 | HR 66 | Temp 98.7°F | Ht 60.0 in | Wt 138.0 lb

## 2015-12-14 DIAGNOSIS — K8500 Idiopathic acute pancreatitis without necrosis or infection: Secondary | ICD-10-CM | POA: Diagnosis not present

## 2015-12-14 MED ORDER — DOCUSATE SODIUM 100 MG PO CAPS: 100.0000 mg | ORAL_CAPSULE | Freq: Two times a day (BID) | ORAL | Status: DC | PRN

## 2015-12-14 NOTE — Progress Notes (Signed)
Primary Care Physician: Hortencia Pilar, MD  Primary Gastroenterologist:  Dr. Lucilla Lame  Chief Complaint  Patient presents with  . Abnormal CT scan    Liver  . Hospitalization Follow-up    HPI: Jaime Berger is a 82 y.o. female here for follow-up after being in the hospital for pancreatitis.  The patient had been having abdominal pain with nausea and vomiting.  The patient was found to have an increased lipase of 238.  The patient's lipase quickly came down to normal after 1 day.  The patient was seen by Dr. Vira Agar who suggested that the patient had a stone that had passed.  She states that she has been doing well except that she has been having some increased fatigue and energy.  Current Outpatient Prescriptions  Medication Sig Dispense Refill  . albuterol (PROVENTIL HFA;VENTOLIN HFA) 108 (90 BASE) MCG/ACT inhaler Inhale into the lungs every 6 (six) hours as needed for wheezing or shortness of breath.    . budesonide-formoterol (SYMBICORT) 160-4.5 MCG/ACT inhaler Inhale 2 puffs into the lungs 2 (two) times daily. Usually only uses in AM    . CARTIA XT 120 MG 24 hr capsule   3  . carvedilol (COREG) 12.5 MG tablet Take 1 tablet (12.5 mg total) by mouth 2 (two) times daily with a meal. 60 tablet 0  . dexlansoprazole (DEXILANT) 60 MG capsule Take 60 mg by mouth at bedtime.     Marland Kitchen diltiazem (DILACOR XR) 120 MG 24 hr capsule Take 120 mg by mouth daily.    Marland Kitchen ipratropium (ATROVENT) 0.06 % nasal spray Place into the nose.    . losartan (COZAAR) 100 MG tablet Take 100 mg by mouth daily.     Marland Kitchen acetaminophen (TYLENOL) 325 MG tablet Take 2 tablets (650 mg total) by mouth every 6 (six) hours as needed for mild pain (or Fever >/= 101). (Patient not taking: Reported on 12/14/2015)    . docusate sodium (COLACE) 100 MG capsule Take 1 capsule (100 mg total) by mouth 2 (two) times daily as needed for mild constipation. 30 capsule 5  . nystatin (NYSTATIN) powder Apply topically 2 (two) times daily.  (Patient not taking: Reported on 12/14/2015) 15 g 0   No current facility-administered medications for this visit.    Allergies as of 12/14/2015 - Review Complete 12/14/2015  Allergen Reaction Noted  . Lipitor [atorvastatin] Other (See Comments) 12/03/2014  . Lovastatin Other (See Comments) 12/03/2014  . Diphenhydramine hcl Hives 12/14/2015  . Doxycycline Nausea And Vomiting 12/03/2014  . Nsaids Other (See Comments) 12/14/2015  . Strawberry (diagnostic) Itching 12/14/2015  . Sucralfate Other (See Comments) 12/14/2015  . Sulfa antibiotics Hives 12/03/2014  . Metformin and related Palpitations 12/03/2014    ROS:  General: Negative for anorexia, weight loss, fever, chills, fatigue, weakness. ENT: Negative for hoarseness, difficulty swallowing , nasal congestion. CV: Negative for chest pain, angina, palpitations, dyspnea on exertion, peripheral edema.  Respiratory: Negative for dyspnea at rest, dyspnea on exertion, cough, sputum, wheezing.  GI: See history of present illness. GU:  Negative for dysuria, hematuria, urinary incontinence, urinary frequency, nocturnal urination.  Endo: Negative for unusual weight change.    Physical Examination:   BP 164/71 mmHg  Pulse 66  Temp(Src) 98.7 F (37.1 C) (Oral)  Ht 5' (1.524 m)  Wt 138 lb (62.596 kg)  BMI 26.95 kg/m2  General: Well-nourished, well-developed in no acute distress.  Eyes: No icterus. Conjunctivae pink. Mouth: Oropharyngeal mucosa moist and pink , no lesions erythema or  exudate. Lungs: Clear to auscultation bilaterally. Non-labored. Heart: Regular rate and rhythm, no murmurs rubs or gallops.  Abdomen: Bowel sounds are normal, nontender, nondistended, no hepatosplenomegaly or masses, no abdominal bruits or hernia , no rebound or guarding.   Extremities: No lower extremity edema. No clubbing or deformities. Neuro: Alert and oriented x 3.  Grossly intact. Skin: Warm and dry, no jaundice.   Psych: Alert and cooperative,  normal mood and affect.  Labs:    Imaging Studies: Ct Abdomen Pelvis W Contrast  12/02/2015  CLINICAL DATA:  Abdominal pain for several weeks with nausea. Hysterectomy. Melanoma. EXAM: CT ABDOMEN AND PELVIS WITH CONTRAST TECHNIQUE: Multidetector CT imaging of the abdomen and pelvis was performed using the standard protocol following bolus administration of intravenous contrast. CONTRAST:  127mL ISOVUE-300 IOPAMIDOL (ISOVUE-300) INJECTION 61% COMPARISON:  None. FINDINGS: Lower chest: Subpleural 3 mm right lower lobe pulmonary nodule (series 5/image 3) . Hepatobiliary: Liver surface appears finely irregular, cannot exclude cirrhosis. No liver mass. Cholecystectomy. No biliary ductal dilatation. Pancreas: Normal, with no mass or duct dilation. Spleen: Normal size. No mass. Adrenals/Urinary Tract: Normal adrenals. No hydronephrosis. Simple 1.9 cm interpolar left renal cyst. Additional subcentimeter hypodense renal cortical lesions in both kidneys are too small to characterize. There is a tiny amount of nondependent gas in the relatively collapsed bladder, with no bladder wall thickening. Stomach/Bowel: Grossly normal stomach. Normal caliber small bowel with no small bowel wall thickening. Appendectomy . Normal large bowel with no diverticulosis, large bowel wall thickening or pericolonic fat stranding. Oral contrast progresses to the distal colon. Vascular/Lymphatic: Atherosclerotic nonaneurysmal abdominal aorta. Patent portal, splenic, hepatic and renal veins. No pathologically enlarged lymph nodes in the abdomen or pelvis. Reproductive: Status post hysterectomy, with no abnormal findings at the vaginal cuff. No adnexal mass. Other: No pneumoperitoneum, ascites or focal fluid collection. Musculoskeletal: No aggressive appearing focal osseous lesions. Mild-to-moderate degenerative changes in the visualized thoracolumbar spine. There is a mild to moderate superior L1 vertebral compression fracture with  approximately 30-40% loss of vertebral body height. IMPRESSION: 1. Mild-to-moderate L1 vertebral compression fracture of uncertain chronicity. 2. Tiny amount of gas in the nondistended bladder. If there is no history of recent bladder instrumentation to explain this gas, recommend correlation with urinalysis to exclude a gas producing cystitis. No appreciable bladder wall thickening. 3. Otherwise no potential acute abnormality. No evidence of bowel obstruction or acute bowel inflammation. 4. Possible cirrhosis; please note this is not a definitive finding. Consider outpatient hepatic elastography for further liver fibrosis risk stratification, as clinically warranted. 5. Right lower lobe 3 mm subpleural pulmonary nodule. No follow-up needed if patient is low-risk. Non-contrast chest CT can be considered in 12 months if patient is high-risk. This recommendation follows the consensus statement: Guidelines for Management of Incidental Pulmonary Nodules Detected on CT Images:From the Fleischner Society 2017; published online before print (10.1148/radiol.IJ:2314499). These results will be called to the ordering clinician or representative by the Radiology Department at the imaging location. Electronically Signed   By: Ilona Sorrel M.D.   On: 12/02/2015 13:31    Assessment and Plan:   DAYLEE CERVIN is a 82 y.o. y/o female who was in the hospital with acute pancreatitis.  The patient has been doing well except for being tired.  There is no abdominal pain or nausea and vomiting.  The patient will contact me if her symptoms return.   Note: This dictation was prepared with Dragon dictation along with smaller phrase technology. Any transcriptional errors that result from  this process are unintentional.

## 2015-12-17 ENCOUNTER — Other Ambulatory Visit: Payer: Self-pay

## 2015-12-17 MED ORDER — DOCUSATE SODIUM 100 MG PO CAPS: 100.0000 mg | ORAL_CAPSULE | Freq: Two times a day (BID) | ORAL | Status: DC | PRN

## 2016-01-20 ENCOUNTER — Emergency Department: Payer: Medicare Other

## 2016-01-20 ENCOUNTER — Encounter: Payer: Self-pay | Admitting: *Deleted

## 2016-01-20 ENCOUNTER — Emergency Department
Admission: EM | Admit: 2016-01-20 | Discharge: 2016-01-20 | Disposition: A | Discharge status: Home / Self Care | Payer: Medicare Other | Attending: Emergency Medicine | Admitting: Emergency Medicine

## 2016-01-20 ENCOUNTER — Other Ambulatory Visit: Payer: Self-pay

## 2016-01-20 DIAGNOSIS — E785 Hyperlipidemia, unspecified: Secondary | ICD-10-CM | POA: Insufficient documentation

## 2016-01-20 DIAGNOSIS — R109 Unspecified abdominal pain: Secondary | ICD-10-CM | POA: Diagnosis present

## 2016-01-20 DIAGNOSIS — J45909 Unspecified asthma, uncomplicated: Secondary | ICD-10-CM | POA: Diagnosis not present

## 2016-01-20 DIAGNOSIS — I10 Essential (primary) hypertension: Secondary | ICD-10-CM | POA: Insufficient documentation

## 2016-01-20 DIAGNOSIS — M1990 Unspecified osteoarthritis, unspecified site: Secondary | ICD-10-CM | POA: Insufficient documentation

## 2016-01-20 DIAGNOSIS — Z7951 Long term (current) use of inhaled steroids: Secondary | ICD-10-CM | POA: Diagnosis not present

## 2016-01-20 DIAGNOSIS — K5900 Constipation, unspecified: Secondary | ICD-10-CM | POA: Diagnosis not present

## 2016-01-20 DIAGNOSIS — N390 Urinary tract infection, site not specified: Secondary | ICD-10-CM | POA: Diagnosis not present

## 2016-01-20 LAB — COMPREHENSIVE METABOLIC PANEL
ALBUMIN: 3.7 g/dL (ref 3.5–5.0)
ALT: 16 U/L (ref 14–54)
AST: 24 U/L (ref 15–41)
Alkaline Phosphatase: 47 U/L (ref 38–126)
Anion gap: 4 — ABNORMAL LOW (ref 5–15)
BILIRUBIN TOTAL: 0.9 mg/dL (ref 0.3–1.2)
BUN: 14 mg/dL (ref 6–20)
CALCIUM: 9.1 mg/dL (ref 8.9–10.3)
CO2: 28 mmol/L (ref 22–32)
Chloride: 104 mmol/L (ref 101–111)
Creatinine, Ser: 0.84 mg/dL (ref 0.44–1.00)
GFR calc Af Amer: >60 mL/min (ref >60)
GFR calc non Af Amer: >60 mL/min (ref >60)
Glucose, Bld: 101 mg/dL — ABNORMAL HIGH (ref 65–99)
POTASSIUM: 4.3 mmol/L (ref 3.5–5.1)
SODIUM: 136 mmol/L (ref 135–145)
TOTAL PROTEIN: 6.7 g/dL (ref 6.5–8.1)

## 2016-01-20 LAB — CBC
HEMATOCRIT: 40.8 % (ref 35.0–47.0)
HEMOGLOBIN: 14.4 g/dL (ref 12.0–16.0)
MCH: 33.9 pg (ref 26.0–34.0)
MCHC: 35.4 g/dL (ref 32.0–36.0)
MCV: 95.9 fL (ref 80.0–100.0)
Platelets: 187 10*3/uL (ref 150–440)
RBC: 4.25 MIL/uL (ref 3.80–5.20)
RDW: 13.6 % (ref 11.5–14.5)
WBC: 8.5 10*3/uL (ref 3.6–11.0)

## 2016-01-20 LAB — URINALYSIS COMPLETE WITH MICROSCOPIC (ARMC ONLY)
Bilirubin Urine: NEGATIVE
GLUCOSE, UA: NEGATIVE mg/dL
HGB URINE DIPSTICK: NEGATIVE
Ketones, ur: NEGATIVE mg/dL
NITRITE: NEGATIVE
PH: 6 (ref 5.0–8.0)
Protein, ur: NEGATIVE mg/dL
SPECIFIC GRAVITY, URINE: 1.003 — AB (ref 1.005–1.030)

## 2016-01-20 LAB — LIPASE, BLOOD: Lipase: 33 U/L (ref 11–51)

## 2016-01-20 LAB — LACTIC ACID, PLASMA: Lactic Acid, Venous: 1.2 mmol/L (ref 0.5–1.9)

## 2016-01-20 LAB — TROPONIN I: Troponin I: <0.03 ng/mL (ref <0.03)

## 2016-01-20 MED ORDER — GI COCKTAIL ~~LOC~~
30.0000 mL | Freq: Once | ORAL | Status: AC
  Administered 2016-01-20: 30 mL via ORAL
  Filled 2016-01-20: qty 30

## 2016-01-20 MED ORDER — FAMOTIDINE IN NACL 20-0.9 MG/50ML-% IV SOLN
20.0000 mg | Freq: Once | INTRAVENOUS | Status: AC
  Administered 2016-01-20: 20 mg via INTRAVENOUS
  Filled 2016-01-20: qty 50

## 2016-01-20 MED ORDER — NITROFURANTOIN MONOHYD MACRO 100 MG PO CAPS: 100.0000 mg | ORAL_CAPSULE | Freq: Two times a day (BID) | ORAL | Status: AC

## 2016-01-20 MED ORDER — SODIUM CHLORIDE 0.9 % IV BOLUS (SEPSIS)
1000.0000 mL | Freq: Once | INTRAVENOUS | Status: AC
  Administered 2016-01-20: 1000 mL via INTRAVENOUS

## 2016-01-20 MED ORDER — ONDANSETRON HCL 4 MG PO TABS: 4.0000 mg | ORAL_TABLET | Freq: Three times a day (TID) | ORAL | Status: DC | PRN

## 2016-01-20 MED ORDER — ONDANSETRON HCL 4 MG/2ML IJ SOLN
4.0000 mg | Freq: Once | INTRAMUSCULAR | Status: AC
  Administered 2016-01-20: 4 mg via INTRAVENOUS
  Filled 2016-01-20: qty 2

## 2016-01-20 MED ORDER — NITROFURANTOIN MONOHYD MACRO 100 MG PO CAPS
100.0000 mg | ORAL_CAPSULE | Freq: Once | ORAL | Status: AC
  Administered 2016-01-20: 100 mg via ORAL
  Filled 2016-01-20: qty 1

## 2016-01-20 NOTE — Discharge Instructions (Signed)
You were seen for abdominal pain. Your found to have a urinary tract infection and constipation. Take MiraLAX twice a day for constipation for 5 days. Make sure to drink plenty of fluids. Also take your antibiotics as prescribed for a urinary tract infection. Follow up with your doctor in 2 days. Return to the emergency department if you have new or worsening abdominal pain, chest pain, flank pain, nausea vomiting, fever, or any new symptoms concerning to you.

## 2016-01-20 NOTE — ED Notes (Signed)
States weakness on and off for several weeks, pt recently tx for pancreatitis, denies any vomiting, pt states hx of gastritis

## 2016-01-20 NOTE — ED Notes (Signed)

## 2016-01-20 NOTE — ED Provider Notes (Signed)
[facility] Emergency Department Provider Note  ____________________________________________  Time seen: Approximately 3:37 PM  I have reviewed the triage vital signs and the nursing notes.   HISTORY  Chief Complaint Weakness and Abdominal Pain   HPI Jaime Berger is a 81 y.o. female h/o gastritis, GERD, melanoma, DM, pancreatitis presents for evaluation of epigastric abdominal pain. Patient reports that the pain has been going on for 3 days. It is intermittent and worse postprandially. Similar to her prior history of reflux. The pain is burning in nature, located in the epigastric region, associated with nausea. Patient reports currently her pain is 2/10 but gets markedly worse after she eats something. She has had 1 episode of nonbloody nonbilious emesis 2 days ago. She reports that she's been trying a bland diet for the last 2 days however continues to have pain after she eats. She reports the pain is similar to her prior episode of pancreatitis in May 2017. Patient status post cholecystectomy and appendectomy many years ago. Her most recent EGD was in June 2016 showing gastritis. She denies NSAID use, alcohol intake. Her daughter is accompanying her and brought her in to make sure there is nothing else going on. Patient denies chest pain, shortness of breath, diarrhea, constipation, fever, dysuria, hematuria, melena. She reports compliance with her dexilon.  Past Medical History  Diagnosis Date  . Family history of adverse reaction to anesthesia     nausea  . Complication of anesthesia     Pt reports during Colonoscopy at [facility] was "overdosed" on meds and died and was brought back  . Hypertension   . Heart murmur     "Yrs ago", no mention recently  . Arthritis     legs, hands, "everywhere"  . GERD (gastroesophageal reflux disease)   . Asthma   . Osteoporosis     back  . Prosthetic eye globe     right  . Vertigo   . Hyperlipidemia   . Liver enzyme  elevation     Patient Active Problem List   Diagnosis Date Noted  . Compression fracture of vertebral column ([place]) 12/02/2015  . Pancreatitis 11/05/2015  . Awareness of heartbeats 09/19/2015  . AG (atrophic gastritis) 03/03/2015  . Blind one eye 01/01/2015  . Drug intolerance 12/03/2014  . HLD (hyperlipidemia) 03/06/2012  . Pseudoaphakia 02/17/2012  . Airway hyperreactivity 01/03/2011  . Controlled diabetes mellitus type II without complication ([place]) XX123456  . Acid reflux 01/03/2011  . Benign essential HTN 01/03/2011  . 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 OP (osteoporosis) 01/03/2011  . Syncope and collapse 01/03/2011  . Malignant melanoma of skin ([place]) 01/03/2011  . Controlled type 2 diabetes mellitus without complication ([place]) [id number]    Past Surgical History  Procedure Laterality Date  . Breast surgery Right     lumpectomy  . Cholecystectomy    . Appendectomy    . Abdominal hysterectomy    . Eye surgery Right 1975     prosthesis after melanoma  . Hand / finger lesion excision      "nodules"  . Esophagogastroduodenoscopy N/A 12/04/2014    Procedure: ESOPHAGOGASTRODUODENOSCOPY (EGD);  Surgeon: Lucilla Lame, MD;  Location: [facility];  Service: Gastroenterology;  Laterality: N/A;  REFERRING DR Hortencia Pilar    Current Outpatient Rx  Name  Route  Sig  Dispense  Refill  . acetaminophen (TYLENOL) 325 MG tablet   Oral   Take 2 tablets (650 mg total) by mouth every 6 (six) hours  as needed for mild pain (or Fever >/= 101). Patient not taking: Reported on 12/14/2015         . albuterol (PROVENTIL HFA;VENTOLIN HFA) 108 (90 BASE) MCG/ACT inhaler   Inhalation   Inhale into the lungs every 6 (six) hours as needed for wheezing or shortness of breath.         . budesonide-formoterol (SYMBICORT) 160-4.5 MCG/ACT inhaler   Inhalation   Inhale 2 puffs into the lungs 2 (two) times daily. Usually only uses in AM         . CARTIA XT 120 MG 24 hr  capsule            3     Dispense as written.   . carvedilol (COREG) 12.5 MG tablet   Oral   Take 1 tablet (12.5 mg total) by mouth 2 (two) times daily with a meal.   60 tablet   0   . dexlansoprazole (DEXILANT) 60 MG capsule   Oral   Take 60 mg by mouth at bedtime.          Marland Kitchen diltiazem (DILACOR XR) 120 MG 24 hr capsule   Oral   Take 120 mg by mouth daily.         Marland Kitchen docusate sodium (COLACE) 100 MG capsule   Oral   Take 1 capsule (100 mg total) by mouth 2 (two) times daily as needed for mild constipation.   180 capsule   3   . ipratropium (ATROVENT) 0.06 % nasal spray   Nasal   Place into the nose.         . losartan (COZAAR) 100 MG tablet   Oral   Take 100 mg by mouth daily.          . nitrofurantoin, macrocrystal-monohydrate, (MACROBID) 100 MG capsule   Oral   Take 1 capsule (100 mg total) by mouth 2 (two) times daily.   10 capsule   0   . nystatin (NYSTATIN) powder   Topical   Apply topically 2 (two) times daily. Patient not taking: Reported on 12/14/2015   15 g   0   . ondansetron (ZOFRAN) 4 MG tablet   Oral   Take 1 tablet (4 mg total) by mouth every 8 (eight) hours as needed for nausea or vomiting.   20 tablet   1     Allergies Lipitor; Lovastatin; Diphenhydramine hcl; Doxycycline; Nsaids; Strawberry (diagnostic); Sucralfate; Sulfa antibiotics; and Metformin and related  History reviewed. No pertinent family history.  Social History Social History  Substance Use Topics  . Smoking status: Never Smoker   . Smokeless tobacco: None  . Alcohol Use: No    Review of Systems  Constitutional: Negative for fever. Eyes: Negative for visual changes. ENT: Negative for sore throat. Cardiovascular: Negative for chest pain. Respiratory: Negative for shortness of breath. Gastrointestinal: + epigastric abdominal pain, nausea, and vomiting Genitourinary: Negative for dysuria. Musculoskeletal: Negative for back pain. Skin: Negative for  rash. Neurological: Negative for headaches, weakness or numbness.  ____________________________________________   PHYSICAL EXAM:  VITAL SIGNS: ED Triage Vitals  Enc Vitals Group     BP 01/20/16 1238 172/81 mmHg     Pulse Rate 01/20/16 1238 61     Resp 01/20/16 1238 18     Temp 01/20/16 1238 98.6 F (37 C)     Temp Source 01/20/16 1238 Oral     SpO2 01/20/16 1238 97 %     Weight 01/20/16 1238 137 lb (62.143 kg)  Height 01/20/16 1238 5\' 1"  (1.549 m)     Head Cir --      Peak Flow --      Pain Score 01/20/16 1239 0     Pain Loc --      Pain Edu? --      Excl. in [place]? --     Constitutional: Alert and oriented. Well appearing and in no apparent distress. HEENT:      Head: Normocephalic and atraumatic.         Eyes: Conjunctivae are normal. Sclera is non-icteric. EOMI. PERRL      Mouth/Throat: Mucous membranes are moist.       Neck: Supple with no signs of meningismus. Cardiovascular: Regular rate and rhythm. No murmurs, gallops, or rubs. 2+ symmetrical distal pulses are present in all extremities. No JVD. Respiratory: Normal respiratory effort. Lungs are clear to auscultation bilaterally. No wheezes, crackles, or rhonchi.  Gastrointestinal: Soft, mild ttp to deep palpation over the epigastric region, non distended with positive bowel sounds. No rebound or guarding. Genitourinary: No CVA tenderness. Musculoskeletal: Nontender with normal range of motion in all extremities. No edema, cyanosis, or erythema of extremities. Neurologic: Normal speech and language. Face is symmetric. Moving all extremities. No gross focal neurologic deficits are appreciated. Skin: Skin is warm, dry and intact. No rash noted. Psychiatric: Mood and affect are normal. Speech and behavior are normal.  ____________________________________________   LABS (all labs ordered are listed, but only abnormal results are displayed)  Labs Reviewed  COMPREHENSIVE METABOLIC PANEL - Abnormal; Notable for the  following:    Glucose, Bld 101 (*)    Anion gap 4 (*)    All other components within normal limits  URINALYSIS COMPLETEWITH MICROSCOPIC ([facility] ONLY) - Abnormal; Notable for the following:    Color, Urine STRAW (*)    APPearance CLEAR (*)    Specific Gravity, Urine 1.003 (*)    Leukocytes, UA 2+ (*)    Bacteria, UA RARE (*)    Squamous Epithelial / LPF 0-5 (*)    All other components within normal limits  URINE CULTURE  LIPASE, BLOOD  CBC  LACTIC ACID, PLASMA  TROPONIN I   ____________________________________________  EKG  ED ECG REPORT I, Rudene Re, the attending physician, personally viewed and interpreted this ECG.  Normal sinus rhythm, rate of 60, normal intervals, normal axis, no ST elevations or depressions. ____________________________________________  RADIOLOGY  KUB: moderate stool burden ____________________________________________   PROCEDURES  Procedure(s) performed: None Critical Care performed:  None ____________________________________________   INITIAL IMPRESSION / ASSESSMENT AND PLAN / ED COURSE  82 y.o. female h/o gastritis, GERD, melanoma, DM, pancreatitis presents for evaluation of epigastric burning post-prandial abdominal pain x 3 days, similar to her pain associated with prior episode of pancreatitis but also similar to her gastritis/ reflux pain. Patient is well-appearing, in no distress, her vital signs are within normal limits, her abdomen is soft with minimal tenderness on this epigastric region, shows normal bowel sounds, no rebound or guarding. Ddx reflux, gastritis, PUD. Less likely ACS with pain that is exacerbated postprandially, EKG with no ischemia. We'll check a troponin. No evidence of acute abdomen, perforation, SBO based on exam and history. Will check labs, lactate, EKG, troponin, KUB to evaluate for pneumoperitoneum, UA. Plan for IVF, IV pepcid, IV zofran.  _________________________ 5:26 PM on  01/20/2016 -----------------------------------------  Labs within normal limits. Normal CMP, normal lipase, normal CBC, negative troponin, normal lactate. UA concerning for urinary tract infection. KUB concerning for moderate amount  of stool. Will start patient on Macrobid and discharged home on a 5 day course of Macrobid and MiraLAX. Patient reports resolution of her pain with the GI cocktail, IV fluids, IV Pepcid, and IV Zofran. She is tolerating by mouth. We'll discharge home.  Pertinent labs & imaging results that were available during my care of the patient were reviewed by me and considered in my medical decision making (see chart for details).   I discussed my evaluation of the patient's symptoms, my clinical impression, and my proposed outpatient treatment plan with patient/ family members. We have discussed anticipatory guidance, scheduled follow-up, and careful return precautions. The patient expresses understanding and is comfortable with the discharge plan. All patient's questions were answered.   ____________________________________________   FINAL CLINICAL IMPRESSION(S) / ED DIAGNOSES  Final diagnoses:  Abdominal pain  Constipation, unspecified constipation type  UTI (lower urinary tract infection)      NEW MEDICATIONS STARTED DURING THIS VISIT:  New Prescriptions   NITROFURANTOIN, MACROCRYSTAL-MONOHYDRATE, (MACROBID) 100 MG CAPSULE    Take 1 capsule (100 mg total) by mouth 2 (two) times daily.   ONDANSETRON (ZOFRAN) 4 MG TABLET    Take 1 tablet (4 mg total) by mouth every 8 (eight) hours as needed for nausea or vomiting.     Note:  This document was prepared using Dragon voice recognition software and may include unintentional dictation errors.    Rudene Re, MD 01/20/16 1730

## 2016-01-20 NOTE — ED Notes (Signed)
Pt able to tolerate saltine crackers and ice water without difficulty.

## 2016-01-22 LAB — URINE CULTURE: Culture: >=100000 — AB

## 2016-02-11 ENCOUNTER — Telehealth: Payer: Self-pay | Admitting: Gastroenterology

## 2016-02-11 NOTE — Telephone Encounter (Signed)
Spoke with doris at this time and she said her mother would like a liquid form of medicine (Carafate)  to help with coating her mothers stomach due to nausea. She stated her mother would like to have the medicine that is in the GI cocktail at the ED.  I let patient know I would check with the providers tomorrow to see if something could be called in.  I let her know G[facility] and Dr.Wohl were on vacation and that I would call her tomorrow.

## 2016-02-11 NOTE — Telephone Encounter (Signed)
Patients daughter, Sydell Axon, called and stated that the Carasate (pill form) isn't working for her mother and needs something else that is in a liquid form. Her mother needs something to coat her stomach.  She doesn't want to wait until Dr. Allen Norris comes back.  Please call.

## 2016-02-12 NOTE — Telephone Encounter (Signed)
Left message on  [facility] prescription line at this time for the below:   GI Cocktail 12 ml Maalox- (167ml) 12 ml Viscous Lidocaine-(156 ml) 6 ml Donnatol- (78 ml) 390 ml Total Dose: 30 ml PO Bid 1 refill  Spoke with Dora the patients daughter at this time to let her know the GI Cocktail was called in to [facility] at this time.

## 2016-02-12 NOTE — Telephone Encounter (Signed)
Spoke with Jaime Berger at this time in regards to the GI Cocktail being to expensive. Her cost is over 300.00 and her mother cannot afford it.  I let her know I would talk to Ginger on Monday to see if there is something else to try. Patient verbalized understanding.

## 2016-02-15 NOTE — Telephone Encounter (Signed)
Spoke with pt and advise her since Dr. Allen Norris is out of town I will have to discuss with him other options for her pain.

## 2016-02-15 NOTE — Telephone Encounter (Signed)
-----   Message from Celene Kras, [place] sent at 02/12/2016 10:12 AM EDT ----- Shaquandra Galano- I called in GI Cocktail for this patient however her cost is over 300.00 dollars and she cannot afford it.  Patient stated the Carafate makes her sick.  I let her know you were on vacation and would be back on Monday.

## 2016-02-16 ENCOUNTER — Telehealth: Payer: Self-pay | Admitting: Gastroenterology

## 2016-02-16 ENCOUNTER — Other Ambulatory Visit: Payer: Self-pay

## 2016-02-16 MED ORDER — ONDANSETRON 4 MG PO TBDP: 4.0000 mg | ORAL_TABLET | Freq: Three times a day (TID) | ORAL | 2 refills | Status: DC | PRN

## 2016-02-16 NOTE — Telephone Encounter (Signed)
She was suppose to call you today.

## 2016-02-16 NOTE — Telephone Encounter (Signed)
Left vm again for pt's daughter Sydell Axon to call me back.

## 2016-03-25 ENCOUNTER — Ambulatory Visit
Admission: EM | Admit: 2016-03-25 | Discharge: 2016-03-25 | Disposition: A | Discharge status: Home / Self Care | Payer: Medicare Other | Attending: Family Medicine | Admitting: Family Medicine

## 2016-03-25 ENCOUNTER — Encounter: Payer: Self-pay | Admitting: Emergency Medicine

## 2016-03-25 ENCOUNTER — Ambulatory Visit: Payer: Medicare Other

## 2016-03-25 DIAGNOSIS — M1990 Unspecified osteoarthritis, unspecified site: Secondary | ICD-10-CM | POA: Insufficient documentation

## 2016-03-25 DIAGNOSIS — K219 Gastro-esophageal reflux disease without esophagitis: Secondary | ICD-10-CM | POA: Diagnosis not present

## 2016-03-25 DIAGNOSIS — E119 Type 2 diabetes mellitus without complications: Secondary | ICD-10-CM | POA: Insufficient documentation

## 2016-03-25 DIAGNOSIS — H5440 Blindness, one eye, unspecified eye: Secondary | ICD-10-CM | POA: Insufficient documentation

## 2016-03-25 DIAGNOSIS — I10 Essential (primary) hypertension: Secondary | ICD-10-CM | POA: Insufficient documentation

## 2016-03-25 DIAGNOSIS — Z85820 Personal history of malignant melanoma of skin: Secondary | ICD-10-CM | POA: Insufficient documentation

## 2016-03-25 DIAGNOSIS — E785 Hyperlipidemia, unspecified: Secondary | ICD-10-CM | POA: Insufficient documentation

## 2016-03-25 DIAGNOSIS — J45901 Unspecified asthma with (acute) exacerbation: Secondary | ICD-10-CM

## 2016-03-25 DIAGNOSIS — R0602 Shortness of breath: Secondary | ICD-10-CM | POA: Diagnosis not present

## 2016-03-25 DIAGNOSIS — K2940 Chronic atrophic gastritis without bleeding: Secondary | ICD-10-CM | POA: Insufficient documentation

## 2016-03-25 DIAGNOSIS — M810 Age-related osteoporosis without current pathological fracture: Secondary | ICD-10-CM | POA: Insufficient documentation

## 2016-03-25 MED ORDER — AMOXICILLIN-POT CLAVULANATE 500-125 MG PO TABS: 1.0000 | ORAL_TABLET | Freq: Three times a day (TID) | ORAL | 0 refills | Status: DC

## 2016-03-25 MED ORDER — PREDNISONE 10 MG PO TABS: ORAL_TABLET | ORAL | 0 refills | Status: DC

## 2016-03-25 MED ORDER — IPRATROPIUM-ALBUTEROL 0.5-2.5 (3) MG/3ML IN SOLN
3.0000 mL | Freq: Once | RESPIRATORY_TRACT | Status: AC
  Administered 2016-03-25: 3 mL via RESPIRATORY_TRACT

## 2016-03-25 MED ORDER — IPRATROPIUM-ALBUTEROL 0.5-2.5 (3) MG/3ML IN SOLN
3.0000 mL | Freq: Once | RESPIRATORY_TRACT | Status: AC
Start: 2016-03-25 — End: 2016-03-25
  Administered 2016-03-25: 3 mL via RESPIRATORY_TRACT

## 2016-03-25 NOTE — ED Provider Notes (Signed)
[facility]    CSN: [id number] Arrival date & time: 03/25/16  [id number]  First Provider Contact:  None       History   Chief Complaint Chief Complaint  Patient presents with  . Shortness of Breath    HPI Jaime Berger is a 82 y.o. female.   The history is provided by the patient.  URI  Presenting symptoms: congestion, cough, fatigue and fever   Severity:  Moderate Onset quality:  Sudden Duration:  1 week Timing:  Constant Progression:  Worsening Chronicity:  New Relieved by:  Nothing Ineffective treatments:  OTC medications Associated symptoms: wheezing   Risk factors: being elderly and chronic respiratory disease (asthma)   Risk factors: no chronic cardiac disease, no chronic kidney disease, no diabetes mellitus, no immunosuppression, no recent illness, no recent travel and no sick contacts     Past Medical History:  Diagnosis Date  . Arthritis    legs, hands, "everywhere"  . Asthma   . Complication of anesthesia    Pt reports during Colonoscopy at [facility] was "overdosed" on meds and died and was brought back  . Family history of adverse reaction to anesthesia    nausea  . GERD (gastroesophageal reflux disease)   . Heart murmur    "Yrs ago", no mention recently  . Hyperlipidemia   . Hypertension   . Liver enzyme elevation   . Osteoporosis    back  . Prosthetic eye globe    right  . Vertigo     Patient Active Problem List   Diagnosis Date Noted  . Compression fracture of vertebral column ([place]) 12/02/2015  . Pancreatitis 11/05/2015  . Awareness of heartbeats 09/19/2015  . AG (atrophic gastritis) 03/03/2015  . Blind one eye 01/01/2015  . Drug intolerance 12/03/2014  . HLD (hyperlipidemia) 03/06/2012  . Pseudoaphakia 02/17/2012  . Airway hyperreactivity 01/03/2011  . Controlled diabetes mellitus type II without complication ([place]) XX123456  . Acid reflux 01/03/2011  . Benign essential HTN 01/03/2011  . Cutaneous malignant melanoma ([place])  01/03/2011  . OP (osteoporosis) 01/03/2011  . Syncope and collapse 01/03/2011  . Malignant melanoma of skin ([place]) 01/03/2011  . Controlled type 2 diabetes mellitus without complication ([place]) [id number]    Past Surgical History:  Procedure Laterality Date  . ABDOMINAL HYSTERECTOMY    . APPENDECTOMY    . BREAST SURGERY Right    lumpectomy  . CHOLECYSTECTOMY    . ESOPHAGOGASTRODUODENOSCOPY N/A 12/04/2014   Procedure: ESOPHAGOGASTRODUODENOSCOPY (EGD);  Surgeon: Lucilla Lame, MD;  Location: [place];  Service: Gastroenterology;  Laterality: N/A;  REFERRING DR Hortencia Pilar  . EYE SURGERY Right 1975    prosthesis after melanoma  . HAND / FINGER LESION EXCISION     "nodules"    OB History    No data available       Home Medications    Prior to Admission medications   Medication Sig Start Date End Date Taking? Authorizing Provider  acetaminophen (TYLENOL) 325 MG tablet Take 2 tablets (650 mg total) by mouth every 6 (six) hours as needed for mild pain (or Fever >/= 101). Patient not taking: Reported on 12/14/2015 11/08/15   Nicholes Mango, MD  albuterol (PROVENTIL HFA;VENTOLIN HFA) 108 (90 BASE) MCG/ACT inhaler Inhale into the lungs every 6 (six) hours as needed for wheezing or shortness of breath.    Historical Provider, MD  amoxicillin-clavulanate (AUGMENTIN) 500-125 MG tablet Take 1 tablet (500 mg total) by mouth 3 (three) times daily. 03/25/16  Norval Gable, MD  budesonide-formoterol [facility]) 160-4.5 MCG/ACT inhaler Inhale 2 puffs into the lungs 2 (two) times daily. Usually only uses in AM    Historical Provider, MD  CARTIA XT 120 MG 24 hr capsule  09/25/15   Historical Provider, MD  carvedilol (COREG) 12.5 MG tablet Take 1 tablet (12.5 mg total) by mouth 2 (two) times daily with a meal. 11/08/15   Nicholes Mango, MD  dexlansoprazole (DEXILANT) 60 MG capsule Take 60 mg by mouth at bedtime.     Historical Provider, MD  diltiazem (DILACOR XR) 120 MG 24 hr capsule Take 120 mg by mouth  daily.    Historical Provider, MD  docusate sodium (COLACE) 100 MG capsule Take 1 capsule (100 mg total) by mouth 2 (two) times daily as needed for mild constipation. 12/17/15   Lucilla Lame, MD  ipratropium (ATROVENT) 0.06 % nasal spray Place into the nose. 06/11/15 06/10/16  Historical Provider, MD  losartan (COZAAR) 100 MG tablet Take 100 mg by mouth daily.     Historical Provider, MD  nystatin (NYSTATIN) powder Apply topically 2 (two) times daily. Patient not taking: Reported on 12/14/2015 11/08/15   Nicholes Mango, MD  ondansetron (ZOFRAN ODT) 4 MG disintegrating tablet Take 1 tablet (4 mg total) by mouth every 8 (eight) hours as needed for nausea or vomiting. 02/16/16   Lucilla Lame, MD  ondansetron (ZOFRAN) 4 MG tablet Take 1 tablet (4 mg total) by mouth every 8 (eight) hours as needed for nausea or vomiting. 01/20/16 01/19/17  Rudene Re, MD  predniSONE (DELTASONE) 10 MG tablet 4 tabs po qd for 2 days, then 2 tabs po qd for 3 days, then 1 tab po qd for 3 days 03/25/16   Norval Gable, MD    Family History History reviewed. No pertinent family history.  Social History Social History  Substance Use Topics  . Smoking status: Never Smoker  . Smokeless tobacco: Never Used  . Alcohol use No     Allergies   Lipitor [atorvastatin]; Lovastatin; Diphenhydramine hcl; Doxycycline; Nsaids; Strawberry (diagnostic); Sucralfate; Sulfa antibiotics; and Metformin and related   Review of Systems Review of Systems  Constitutional: Positive for fatigue and fever.  HENT: Positive for congestion.   Respiratory: Positive for cough and wheezing.      Physical Exam Triage Vital Signs ED Triage Vitals  Enc Vitals Group     BP 03/25/16 0857 (!) 196/72     Pulse Rate 03/25/16 0857 83     Resp 03/25/16 0857 19     Temp 03/25/16 0857 99.3 F (37.4 C)     Temp Source 03/25/16 0857 Oral     SpO2 03/25/16 0857 94 %     Weight 03/25/16 0858 136 lb (61.7 kg)     Height 03/25/16 0858 5\' 1"  (1.549 m)      Head Circumference --      Peak Flow --      Pain Score 03/25/16 0859 4     Pain Loc --      Pain Edu? --      Excl. in [place]? --    No data found.   Updated Vital Signs BP (!) 196/72 (BP Location: Left Arm)   Pulse 77   Temp 99.3 F (37.4 C) (Oral)   Resp 19   Ht 5\' 1"  (1.549 m)   Wt 136 lb (61.7 kg)   SpO2 95%   BMI 25.70 kg/m   Visual Acuity Right Eye Distance:   Left Eye Distance:  Bilateral Distance:    Right Eye Near:   Left Eye Near:    Bilateral Near:     Physical Exam  Constitutional: She appears well-developed and well-nourished. No distress.  HENT:  Head: Normocephalic and atraumatic.  Right Ear: Tympanic membrane, external ear and ear canal normal.  Left Ear: Tympanic membrane, external ear and ear canal normal.  Nose: Mucosal edema and rhinorrhea present. No nose lacerations, sinus tenderness, nasal deformity, septal deviation or nasal septal hematoma. No epistaxis.  No foreign bodies. Right sinus exhibits maxillary sinus tenderness and frontal sinus tenderness. Left sinus exhibits maxillary sinus tenderness and frontal sinus tenderness.  Mouth/Throat: Uvula is midline, oropharynx is clear and moist and mucous membranes are normal. No oropharyngeal exudate.  Eyes: Conjunctivae and EOM are normal. Pupils are equal, round, and reactive to light. Right eye exhibits no discharge. Left eye exhibits no discharge. No scleral icterus.  Neck: Normal range of motion. Neck supple. No thyromegaly present.  Cardiovascular: Normal rate, regular rhythm and normal heart sounds.   Pulmonary/Chest: Effort normal. No respiratory distress. She has wheezes (diffuse expiratory wheezes bilaterally). She has no rales.  Lymphadenopathy:    She has no cervical adenopathy.  Skin: She is not diaphoretic.  Nursing note and vitals reviewed.    UC Treatments / Results  Labs (all labs ordered are listed, but only abnormal results are displayed) Labs Reviewed - No data to  display  EKG  EKG Interpretation None       Radiology Dg Chest 2 View  Result Date: 03/25/2016 CLINICAL DATA:  Shortness of breath, productive cough. EXAM: CHEST  2 VIEW COMPARISON:  06/04/2013 FINDINGS: Mild hyperinflation of the lungs. Heart and mediastinal contours are within normal limits. No focal opacities or effusions. No acute bony abnormality. IMPRESSION: Mild hyperinflation.  No active cardiopulmonary disease. Electronically Signed   By: Rolm Baptise M.D.   On: 03/25/2016 09:33    Procedures Procedures (including critical care time)  Medications Ordered in UC Medications  ipratropium-albuterol (DUONEB) 0.5-2.5 (3) MG/3ML nebulizer solution 3 mL (3 mLs Nebulization Given 03/25/16 0908)  ipratropium-albuterol (DUONEB) 0.5-2.5 (3) MG/3ML nebulizer solution 3 mL (3 mLs Nebulization Given 03/25/16 0959)     Initial Impression / Assessment and Plan / UC Course  I have reviewed the triage vital signs and the nursing notes.  Pertinent labs & imaging results that were available during my care of the patient were reviewed by me and considered in my medical decision making (see chart for details).  Clinical Course    Final Clinical Impressions(s) / UC Diagnoses   Final diagnoses:  Asthma exacerbation    New Prescriptions New Prescriptions   AMOXICILLIN-CLAVULANATE (AUGMENTIN) 500-125 MG TABLET    Take 1 tablet (500 mg total) by mouth 3 (three) times daily.   PREDNISONE (DELTASONE) 10 MG TABLET    4 tabs po qd for 2 days, then 2 tabs po qd for 3 days, then 1 tab po qd for 3 days   1. x-ray results and diagnosis reviewed with patient 2. rx as per orders above; reviewed possible side effects, interactions, risks and benefits; also continue current home inhaler medications 3. Recommend supportive treatment with rest, increased fluids 4. Follow-up prn if symptoms worsen or don't improve   Norval Gable, MD 03/25/16 1012

## 2016-03-25 NOTE — ED Triage Notes (Signed)
Patient c/o SOB that started last night.  Patient states that she saw here PCP on Wed and states that her flu test was negative and her blood work was normal.

## 2016-10-21 ENCOUNTER — Emergency Department: Payer: Medicare Other

## 2016-10-21 ENCOUNTER — Encounter: Payer: Self-pay | Admitting: Emergency Medicine

## 2016-10-21 ENCOUNTER — Observation Stay
Admission: EM | Admit: 2016-10-21 | Discharge: 2016-10-22 | Disposition: A | Discharge status: Home / Self Care | Payer: Medicare Other | Attending: Specialist | Admitting: Specialist

## 2016-10-21 DIAGNOSIS — Z882 Allergy status to sulfonamides status: Secondary | ICD-10-CM | POA: Insufficient documentation

## 2016-10-21 DIAGNOSIS — E785 Hyperlipidemia, unspecified: Secondary | ICD-10-CM | POA: Insufficient documentation

## 2016-10-21 DIAGNOSIS — M810 Age-related osteoporosis without current pathological fracture: Secondary | ICD-10-CM | POA: Diagnosis not present

## 2016-10-21 DIAGNOSIS — Z85828 Personal history of other malignant neoplasm of skin: Secondary | ICD-10-CM | POA: Insufficient documentation

## 2016-10-21 DIAGNOSIS — I10 Essential (primary) hypertension: Secondary | ICD-10-CM | POA: Diagnosis not present

## 2016-10-21 DIAGNOSIS — Z7951 Long term (current) use of inhaled steroids: Secondary | ICD-10-CM | POA: Insufficient documentation

## 2016-10-21 DIAGNOSIS — E119 Type 2 diabetes mellitus without complications: Secondary | ICD-10-CM | POA: Insufficient documentation

## 2016-10-21 DIAGNOSIS — M19041 Primary osteoarthritis, right hand: Secondary | ICD-10-CM | POA: Diagnosis not present

## 2016-10-21 DIAGNOSIS — Z91018 Allergy to other foods: Secondary | ICD-10-CM | POA: Diagnosis not present

## 2016-10-21 DIAGNOSIS — Z7982 Long term (current) use of aspirin: Secondary | ICD-10-CM | POA: Insufficient documentation

## 2016-10-21 DIAGNOSIS — Z881 Allergy status to other antibiotic agents status: Secondary | ICD-10-CM | POA: Insufficient documentation

## 2016-10-21 DIAGNOSIS — Z79899 Other long term (current) drug therapy: Secondary | ICD-10-CM | POA: Insufficient documentation

## 2016-10-21 DIAGNOSIS — R748 Abnormal levels of other serum enzymes: Secondary | ICD-10-CM | POA: Insufficient documentation

## 2016-10-21 DIAGNOSIS — Z90710 Acquired absence of both cervix and uterus: Secondary | ICD-10-CM | POA: Insufficient documentation

## 2016-10-21 DIAGNOSIS — R0602 Shortness of breath: Secondary | ICD-10-CM | POA: Diagnosis present

## 2016-10-21 DIAGNOSIS — Z8249 Family history of ischemic heart disease and other diseases of the circulatory system: Secondary | ICD-10-CM | POA: Insufficient documentation

## 2016-10-21 DIAGNOSIS — Z888 Allergy status to other drugs, medicaments and biological substances status: Secondary | ICD-10-CM | POA: Insufficient documentation

## 2016-10-21 DIAGNOSIS — K2940 Chronic atrophic gastritis without bleeding: Secondary | ICD-10-CM | POA: Diagnosis not present

## 2016-10-21 DIAGNOSIS — R42 Dizziness and giddiness: Secondary | ICD-10-CM | POA: Insufficient documentation

## 2016-10-21 DIAGNOSIS — M19042 Primary osteoarthritis, left hand: Secondary | ICD-10-CM | POA: Diagnosis not present

## 2016-10-21 DIAGNOSIS — K219 Gastro-esophageal reflux disease without esophagitis: Secondary | ICD-10-CM | POA: Insufficient documentation

## 2016-10-21 DIAGNOSIS — Z809 Family history of malignant neoplasm, unspecified: Secondary | ICD-10-CM | POA: Insufficient documentation

## 2016-10-21 DIAGNOSIS — H5440 Blindness, one eye, unspecified eye: Secondary | ICD-10-CM | POA: Diagnosis not present

## 2016-10-21 DIAGNOSIS — R002 Palpitations: Secondary | ICD-10-CM | POA: Diagnosis present

## 2016-10-21 DIAGNOSIS — J45909 Unspecified asthma, uncomplicated: Secondary | ICD-10-CM | POA: Diagnosis not present

## 2016-10-21 DIAGNOSIS — R011 Cardiac murmur, unspecified: Secondary | ICD-10-CM | POA: Insufficient documentation

## 2016-10-21 DIAGNOSIS — M1991 Primary osteoarthritis, unspecified site: Secondary | ICD-10-CM | POA: Insufficient documentation

## 2016-10-21 DIAGNOSIS — I493 Ventricular premature depolarization: Secondary | ICD-10-CM | POA: Insufficient documentation

## 2016-10-21 DIAGNOSIS — Z825 Family history of asthma and other chronic lower respiratory diseases: Secondary | ICD-10-CM | POA: Insufficient documentation

## 2016-10-21 DIAGNOSIS — F419 Anxiety disorder, unspecified: Secondary | ICD-10-CM | POA: Diagnosis not present

## 2016-10-21 DIAGNOSIS — Z970 Presence of artificial eye: Secondary | ICD-10-CM | POA: Diagnosis not present

## 2016-10-21 DIAGNOSIS — R079 Chest pain, unspecified: Secondary | ICD-10-CM | POA: Diagnosis not present

## 2016-10-21 DIAGNOSIS — Z833 Family history of diabetes mellitus: Secondary | ICD-10-CM | POA: Insufficient documentation

## 2016-10-21 DIAGNOSIS — I700 Atherosclerosis of aorta: Secondary | ICD-10-CM | POA: Diagnosis not present

## 2016-10-21 DIAGNOSIS — Z9049 Acquired absence of other specified parts of digestive tract: Secondary | ICD-10-CM | POA: Insufficient documentation

## 2016-10-21 LAB — CBC
HEMATOCRIT: 41.4 % (ref 35.0–47.0)
Hemoglobin: 13.8 g/dL (ref 12.0–16.0)
MCH: 30.7 pg (ref 26.0–34.0)
MCHC: 33.4 g/dL (ref 32.0–36.0)
MCV: 91.8 fL (ref 80.0–100.0)
Platelets: 233 10*3/uL (ref 150–440)
RBC: 4.51 MIL/uL (ref 3.80–5.20)
RDW: 14.1 % (ref 11.5–14.5)
WBC: 8.7 10*3/uL (ref 3.6–11.0)

## 2016-10-21 LAB — BASIC METABOLIC PANEL
Anion gap: 6 (ref 5–15)
BUN: 19 mg/dL (ref 6–20)
CHLORIDE: 104 mmol/L (ref 101–111)
CO2: 26 mmol/L (ref 22–32)
Calcium: 9.5 mg/dL (ref 8.9–10.3)
Creatinine, Ser: 0.66 mg/dL (ref 0.44–1.00)
GFR calc Af Amer: >60 mL/min (ref >60)
GFR calc non Af Amer: >60 mL/min (ref >60)
GLUCOSE: 101 mg/dL — AB (ref 65–99)
POTASSIUM: 4 mmol/L (ref 3.5–5.1)
Sodium: 136 mmol/L (ref 135–145)

## 2016-10-21 LAB — TROPONIN I: Troponin I: <0.03 ng/mL (ref <0.03)

## 2016-10-21 LAB — TSH: TSH: 2.624 u[IU]/mL (ref 0.350–4.500)

## 2016-10-21 LAB — MAGNESIUM: Magnesium: 2.2 mg/dL (ref 1.7–2.4)

## 2016-10-21 MED ORDER — MOMETASONE FURO-FORMOTEROL FUM 200-5 MCG/ACT IN AERO
2.0000 | INHALATION_SPRAY | Freq: Two times a day (BID) | RESPIRATORY_TRACT | Status: DC
  Administered 2016-10-21 – 2016-10-22 (×2): 2 via RESPIRATORY_TRACT
  Filled 2016-10-21: qty 8.8

## 2016-10-21 MED ORDER — HYDRALAZINE HCL 20 MG/ML IJ SOLN
2.0000 mg | Freq: Once | INTRAMUSCULAR | Status: AC
  Administered 2016-10-21: 2 mg via INTRAVENOUS

## 2016-10-21 MED ORDER — CARVEDILOL 12.5 MG PO TABS
12.5000 mg | ORAL_TABLET | Freq: Two times a day (BID) | ORAL | Status: DC
  Administered 2016-10-21 – 2016-10-22 (×2): 12.5 mg via ORAL
  Filled 2016-10-21: qty 1

## 2016-10-21 MED ORDER — HYDRALAZINE HCL 20 MG/ML IJ SOLN
INTRAMUSCULAR | Status: AC
  Filled 2016-10-21: qty 1

## 2016-10-21 MED ORDER — FAMOTIDINE 20 MG PO TABS
20.0000 mg | ORAL_TABLET | Freq: Every day | ORAL | Status: DC
  Administered 2016-10-22: 20 mg via ORAL
  Filled 2016-10-21: qty 1

## 2016-10-21 MED ORDER — DILTIAZEM HCL ER COATED BEADS 120 MG PO CP24
120.0000 mg | ORAL_CAPSULE | Freq: Every day | ORAL | Status: DC
  Administered 2016-10-22: 120 mg via ORAL
  Filled 2016-10-21 (×2): qty 1

## 2016-10-21 MED ORDER — ALBUTEROL SULFATE (2.5 MG/3ML) 0.083% IN NEBU: 2.5000 mg | INHALATION_SOLUTION | Freq: Four times a day (QID) | RESPIRATORY_TRACT | Status: DC | PRN

## 2016-10-21 MED ORDER — ACETAMINOPHEN 325 MG PO TABS
650.0000 mg | ORAL_TABLET | ORAL | Status: DC | PRN
  Filled 2016-10-21: qty 2

## 2016-10-21 MED ORDER — MORPHINE SULFATE (PF) 4 MG/ML IV SOLN: 2.0000 mg | INTRAVENOUS | Status: DC | PRN

## 2016-10-21 MED ORDER — HEPARIN SODIUM (PORCINE) 5000 UNIT/ML IJ SOLN
5000.0000 [IU] | Freq: Three times a day (TID) | INTRAMUSCULAR | Status: DC
  Administered 2016-10-21 – 2016-10-22 (×2): 5000 [IU] via SUBCUTANEOUS
  Filled 2016-10-21 (×2): qty 1

## 2016-10-21 MED ORDER — CALCIUM CARBONATE ANTACID 500 MG PO CHEW
1.0000 | CHEWABLE_TABLET | Freq: Three times a day (TID) | ORAL | Status: DC | PRN
  Administered 2016-10-21: 200 mg via ORAL
  Filled 2016-10-21: qty 1

## 2016-10-21 MED ORDER — ASPIRIN 325 MG PO TABS
325.0000 mg | ORAL_TABLET | Freq: Every day | ORAL | Status: DC
  Administered 2016-10-21 – 2016-10-22 (×2): 325 mg via ORAL
  Filled 2016-10-21 (×2): qty 1

## 2016-10-21 MED ORDER — SODIUM CHLORIDE 0.9 % IV SOLN
INTRAVENOUS | Status: DC
  Administered 2016-10-21 – 2016-10-22 (×2): via INTRAVENOUS

## 2016-10-21 MED ORDER — GI COCKTAIL ~~LOC~~
30.0000 mL | Freq: Four times a day (QID) | ORAL | Status: DC | PRN
  Filled 2016-10-21: qty 30

## 2016-10-21 MED ORDER — LOSARTAN POTASSIUM 50 MG PO TABS
100.0000 mg | ORAL_TABLET | Freq: Every day | ORAL | Status: DC
  Administered 2016-10-22: 100 mg via ORAL
  Filled 2016-10-21: qty 2

## 2016-10-21 MED ORDER — CARVEDILOL 6.25 MG PO TABS
ORAL_TABLET | ORAL | Status: AC
  Filled 2016-10-21: qty 2

## 2016-10-21 MED ORDER — ONDANSETRON 4 MG PO TBDP
4.0000 mg | ORAL_TABLET | Freq: Three times a day (TID) | ORAL | Status: DC | PRN
  Administered 2016-10-21: 4 mg via ORAL
  Filled 2016-10-21 (×2): qty 1

## 2016-10-21 MED ORDER — ALBUTEROL SULFATE HFA 108 (90 BASE) MCG/ACT IN AERS: 1.0000 | INHALATION_SPRAY | Freq: Four times a day (QID) | RESPIRATORY_TRACT | Status: DC | PRN

## 2016-10-21 NOTE — ED Provider Notes (Addendum)
[facility] Emergency Department Provider Note  ____________________________________________   I have reviewed the triage vital signs and the nursing notes.   HISTORY  Chief Complaint Chest Pain; Palpitations; and Shortness of Breath    HPI Jaime Berger is a 83 y.o. female  who presents today complaining of palpitations and shortness of breath.  States that this has been going on for one week. She was admitted for observation stay in the emergency room last Saturday. However symptoms and progressed and worsened. She denies chest pain but she does have intermittent chest heaviness which is worse when she exerts herself. She has no pain at this time per she is also feeling more from PVCs. She also has exertional dyspnea. Denies leg swelling. Denies personal or family history of PE or DVT or or recent travel. Patient has not had an echocardiogram since last year, 2017 she did have an EF of 55%. She's never had stents or cardiac history. She denies any cough or URI symptoms.  The patient states that she was to increase her carvedilol but has not yet gotten around to doing so. She is compliant with her medications.     Past Medical History:  Diagnosis Date  . Arthritis    legs, hands, "everywhere"  . Asthma   . Complication of anesthesia    Pt reports during Colonoscopy at [facility] was "overdosed" on meds and died and was brought back  . Family history of adverse reaction to anesthesia    nausea  . GERD (gastroesophageal reflux disease)   . Heart murmur    "Yrs ago", no mention recently  . Hyperlipidemia   . Hypertension   . Liver enzyme elevation   . Osteoporosis    back  . Prosthetic eye globe    right  . Vertigo     Patient Active Problem List   Diagnosis Date Noted  . Compression fracture of vertebral column ([place]) 12/02/2015  . Pancreatitis 11/05/2015  . Awareness of heartbeats 09/19/2015  . AG (atrophic gastritis) 03/03/2015  . Blind one eye  01/01/2015  . Drug intolerance 12/03/2014  . HLD (hyperlipidemia) 03/06/2012  . Pseudoaphakia 02/17/2012  . Airway hyperreactivity 01/03/2011  . Controlled diabetes mellitus type II without complication ([place]) 10/96/0454  . Acid reflux 01/03/2011  . Benign essential HTN 01/03/2011  . 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 OP (osteoporosis) 01/03/2011  . Syncope and collapse 01/03/2011  . Malignant melanoma of skin ([employer]) 01/03/2011  . Controlled type 2 diabetes mellitus without complication ([place]) 09/81/1914    Past Surgical History:  Procedure Laterality Date  . ABDOMINAL HYSTERECTOMY    . APPENDECTOMY    . BREAST SURGERY Right    lumpectomy  . CHOLECYSTECTOMY    . ESOPHAGOGASTRODUODENOSCOPY N/A 12/04/2014   Procedure: ESOPHAGOGASTRODUODENOSCOPY (EGD);  Surgeon: Lucilla Lame, MD;  Location: [place];  Service: Gastroenterology;  Laterality: N/A;  REFERRING DR Hortencia Pilar  . EYE SURGERY Right 1975    prosthesis after melanoma  . HAND / FINGER LESION EXCISION     "nodules"    Prior to Admission medications   Medication Sig Start Date End Date Taking? Authorizing Provider  acetaminophen (TYLENOL) 325 MG tablet Take 2 tablets (650 mg total) by mouth every 6 (six) hours as needed for mild pain (or Fever >/= 101). Patient not taking: Reported on 12/14/2015 11/08/15   Nicholes Mango, MD  albuterol (PROVENTIL HFA;VENTOLIN HFA) 108 (90 BASE) MCG/ACT inhaler Inhale into the lungs every 6 (six) hours as  needed for wheezing or shortness of breath.    Historical Provider, MD  amoxicillin-clavulanate (AUGMENTIN) 500-125 MG tablet Take 1 tablet (500 mg total) by mouth 3 (three) times daily. 03/25/16   Norval Gable, MD  budesonide-formoterol (SYMBICORT) 160-4.5 MCG/ACT inhaler Inhale 2 puffs into the lungs 2 (two) times daily. Usually only uses in AM    Historical Provider, MD  CARTIA XT 120 MG 24 hr capsule  09/25/15   Historical Provider, MD  carvedilol (COREG) 12.5 MG tablet Take  1 tablet (12.5 mg total) by mouth 2 (two) times daily with a meal. 11/08/15   Nicholes Mango, MD  dexlansoprazole (DEXILANT) 60 MG capsule Take 60 mg by mouth at bedtime.     Historical Provider, MD  diltiazem (DILACOR XR) 120 MG 24 hr capsule Take 120 mg by mouth daily.    Historical Provider, MD  docusate sodium (COLACE) 100 MG capsule Take 1 capsule (100 mg total) by mouth 2 (two) times daily as needed for mild constipation. 12/17/15   Lucilla Lame, MD  losartan (COZAAR) 100 MG tablet Take 100 mg by mouth daily.     Historical Provider, MD  nystatin (NYSTATIN) powder Apply topically 2 (two) times daily. Patient not taking: Reported on 12/14/2015 11/08/15   Nicholes Mango, MD  ondansetron (ZOFRAN ODT) 4 MG disintegrating tablet Take 1 tablet (4 mg total) by mouth every 8 (eight) hours as needed for nausea or vomiting. 02/16/16   Lucilla Lame, MD  ondansetron (ZOFRAN) 4 MG tablet Take 1 tablet (4 mg total) by mouth every 8 (eight) hours as needed for nausea or vomiting. 01/20/16 01/19/17  Rudene Re, MD  predniSONE (DELTASONE) 10 MG tablet 4 tabs po qd for 2 days, then 2 tabs po qd for 3 days, then 1 tab po qd for 3 days 03/25/16   Norval Gable, MD    Allergies Lipitor [atorvastatin]; Lovastatin; Diphenhydramine hcl; Doxycycline; Nsaids; Strawberry (diagnostic); Sucralfate; Sulfa antibiotics; and Metformin and related  No family history on file.  Social History Social History  Substance Use Topics  . Smoking status: Never Smoker  . Smokeless tobacco: Never Used  . Alcohol use No    Review of Systems Constitutional: No fever/chills Eyes: No visual changes. ENT: No sore throat. No stiff neck no neck pain Cardiovascular: Denies chest pain Does have chest heaviness with exertion Respiratory: Has shortness of breath. Gastrointestinal:   no vomiting.  No diarrhea.  No constipation. Genitourinary: Negative for dysuria. Musculoskeletal: Negative lower extremity swelling Skin: Negative for  rash. Neurological: Negative for severe headaches, focal weakness or numbness. 10-point ROS otherwise negative.  ____________________________________________   PHYSICAL EXAM:  VITAL SIGNS: ED Triage Vitals  Enc Vitals Group     BP 10/21/16 1233 (!) 173/73     Pulse Rate 10/21/16 1233 65     Resp 10/21/16 1233 18     Temp 10/21/16 1233 99.1 F (37.3 C)     Temp Source 10/21/16 1233 Oral     SpO2 10/21/16 1233 98 %     Weight 10/21/16 1234 136 lb (61.7 kg)     Height 10/21/16 1234 5\' 1"  (1.549 m)     Head Circumference --      Peak Flow --      Pain Score 10/21/16 1233 3     Pain Loc --      Pain Edu? --      Excl. in [place]? --     Constitutional: Alert and oriented. Well appearing and in no acute  distress. Eyes: Conjunctivae are normal. PERRL. EOMI. Head: Atraumatic. Nose: No congestion/rhinnorhea. Mouth/Throat: Mucous membranes are moist.  Oropharynx non-erythematous. Neck: No stridor.   Nontender with no meningismus Cardiovascular: Normal rate, regular rhythm. Grossly normal heart sounds.  Good peripheral circulation. Respiratory: Normal respiratory effort.  No retractions. Lungs CTAB. Abdominal: Soft and nontender. No distention. No guarding no rebound Back:  There is no focal tenderness or step off.  there is no midline tenderness there are no lesions noted. there is no CVA tenderness Musculoskeletal: No lower extremity tenderness, no upper extremity tenderness. No joint effusions, no DVT signs strong distal pulses no edema Neurologic:  Normal speech and language. No gross focal neurologic deficits are appreciated.  Skin:  Skin is warm, dry and intact. No rash noted. Psychiatric: Mood and affect are normal. Speech and behavior are normal.  ____________________________________________   LABS (all labs ordered are listed, but only abnormal results are displayed)  Labs Reviewed  BASIC METABOLIC PANEL - Abnormal; Notable for the following:       Result Value   Glucose,  Bld 101 (*)    All other components within normal limits  CBC  TROPONIN I  MAGNESIUM  TSH   ____________________________________________  EKG  I personally interpreted any EKGs ordered by me or triage Sinus rhythm at 70 bpm, PVCs noted, no acute ST elevation or depression, borderline LVH. ____________________________________________  RADIOLOGY  I reviewed any imaging ordered by me or triage that were performed during my shift and, if possible, patient and/or family made aware of any abnormal findings. ____________________________________________   PROCEDURES  Procedure(s) performed: None  Procedures  Critical Care performed: None  ____________________________________________   INITIAL IMPRESSION / ASSESSMENT AND PLAN / ED COURSE  Pertinent labs & imaging results that were available during my care of the patient were reviewed by me and considered in my medical decision making (see chart for details).  Patient here with exertional symptoms that are worsening over the week, low suspicion for PE. Feel that this is more likely cardiogenic given multiple PVCs and exertional symptoms. Patient will be admitted to the hospital for further evaluation. He is asymptomatic at this time.    ____________________________________________   FINAL CLINICAL IMPRESSION(S) / ED DIAGNOSES  Final diagnoses:  None      This chart was dictated using voice recognition software.  Despite best efforts to proofread,  errors can occur which can change meaning.      Schuyler Amor, MD 10/21/16 [place], MD 10/21/16 [phone]

## 2016-10-21 NOTE — H&P (Signed)
Jaime Bridge, DO Physician [facility]el) Internal Medicine  H&P Date of Service: 10/21/2016 5:00 PM    Expand All Collapse All   [] Hide copied text [place] [facility] Admission History and Physical Jaime Berger, D.O.  ---------------------------------------------------------------------------------------------------------------------   PATIENT NAME: Jaime Berger MR#: [id number] DATE OF BIRTH: [birth year] DATE OF ADMISSION: 10/21/2016 PRIMARY CARE PHYSICIAN: Hortencia Pilar, MD  REQUESTING/REFERRING PHYSICIAN: ED Dr. Burlene Arnt  CHIEF COMPLAINT:    Chief Complaint  Patient presents with  . Chest Pain  . Palpitations  . Shortness of Breath    HISTORY OF PRESENT ILLNESS: Jaime Berger is a 83 y.o. female with a known history of OA, asthma, GERD, HLD, HTN, DM osteoporosis presents to the emergency department for evaluation of palpitations, weakness.  Patient was in a usual state of health until one week ago when she began experiencing worsening palpitations. She has a history of PVCs with palpitations but her symptoms became worse and were associated with "clusters of shortness of breath."  She presented to [facility] ED where she was observed overnight.  She was supposed to increased her Coreg to 12.5 BID but she did not.  She returns today with worsening symptoms of palpitations, now associated with weakness, SOB and chest heaviness.     Patient denies fevers/chills, weakness, dizziness, shortness of breath, N/V/C/D, abdominal pain, dysuria/frequency, changes in mental status.   Otherwise there has been no change in status. Patient has been taking medication as prescribed and there has been no recent change in medication or diet.  There has been no recent illness, travel or sick contacts.     PAST MEDICAL HISTORY:     Past Medical History:  Diagnosis Date  . Arthritis    legs, hands, "everywhere"  . Asthma   . Complication of anesthesia      Pt reports during Colonoscopy at [facility] was "overdosed" on meds and died and was brought back  . Family history of adverse reaction to anesthesia    nausea  . GERD (gastroesophageal reflux disease)   . Heart murmur    "Yrs ago", no mention recently  . Hyperlipidemia   . Hypertension   . Liver enzyme elevation   . Osteoporosis    back  . Prosthetic eye globe    right  . Vertigo       PAST SURGICAL HISTORY:      Past Surgical History:  Procedure Laterality Date  . ABDOMINAL HYSTERECTOMY    . APPENDECTOMY    . BREAST SURGERY Right    lumpectomy  . CHOLECYSTECTOMY    . ESOPHAGOGASTRODUODENOSCOPY N/A 12/04/2014   Procedure: ESOPHAGOGASTRODUODENOSCOPY (EGD);  Surgeon: Lucilla Lame, MD;  Location: [facility];  Service: Gastroenterology;  Laterality: N/A;  REFERRING DR Hortencia Pilar  . EYE SURGERY Right 1975    prosthesis after melanoma  . HAND / FINGER LESION EXCISION     "nodules"      SOCIAL HISTORY:     Social History  Substance Use Topics  . Smoking status: Never Smoker  . Smokeless tobacco: Never Used  . Alcohol use No     Family History   Medical History Relation Name Comments  Diabetes Father    Heart disease Father    Asthma Mother    Diabetes Mother    Aneurysm Sister    Cancer Son        MEDICATIONS AT HOME:        Prior to Admission medications   Medication Sig Start Date End  Date Taking? Authorizing Provider  albuterol (PROVENTIL HFA;VENTOLIN HFA) 108 (90 BASE) MCG/ACT inhaler Inhale into the lungs every 6 (six) hours as needed for wheezing or shortness of breath.   Yes Historical Provider, MD  aspirin 325 MG tablet Take 325 mg by mouth every 4 (four) hours as needed.   Yes Historical Provider, MD  budesonide-formoterol (SYMBICORT) 160-4.5 MCG/ACT inhaler Inhale 2 puffs into the lungs 2 (two) times daily. Usually only uses in AM   Yes Historical Provider, MD  carvedilol (COREG) 12.5 MG  tablet Take 1 tablet (12.5 mg total) by mouth 2 (two) times daily with a meal. Patient taking differently: Take 6.25 mg by mouth 2 (two) times daily with a meal.  11/08/15  Yes Nicholes Mango, MD  diltiazem (DILACOR XR) 120 MG 24 hr capsule Take 120 mg by mouth daily.   Yes Historical Provider, MD  famotidine (PEPCID) 20 MG tablet Take 20 mg by mouth daily.   Yes Historical Provider, MD  losartan (COZAAR) 100 MG tablet Take 100 mg by mouth daily.    Yes Historical Provider, MD  ondansetron (ZOFRAN ODT) 4 MG disintegrating tablet Take 1 tablet (4 mg total) by mouth every 8 (eight) hours as needed for nausea or vomiting. Patient taking differently: Take 8 mg by mouth every 8 (eight) hours as needed for nausea or vomiting.  02/16/16  Yes Lucilla Lame, MD  acetaminophen (TYLENOL) 325 MG tablet Take 2 tablets (650 mg total) by mouth every 6 (six) hours as needed for mild pain (or Fever >/= 101). Patient not taking: Reported on 12/14/2015 11/08/15   Nicholes Mango, MD  docusate sodium (COLACE) 100 MG capsule Take 1 capsule (100 mg total) by mouth 2 (two) times daily as needed for mild constipation. Patient not taking: Reported on 10/21/2016 12/17/15   Lucilla Lame, MD  nystatin (NYSTATIN) powder Apply topically 2 (two) times daily. Patient not taking: Reported on 10/21/2016 11/08/15   Nicholes Mango, MD      DRUG ALLERGIES:      Allergies  Allergen Reactions  . Lipitor [Atorvastatin] Other (See Comments)    Irregular heartbeat  . Lovastatin Other (See Comments)    Irregular heartbeat  . Diphenhydramine Hcl Hives    And swelling  . Doxycycline Nausea And Vomiting  . Nsaids Other (See Comments)    Bad gastritis  . Strawberry (Diagnostic) Itching    Only if pt eats a lot per pt  . Sucralfate Other (See Comments)  . Sulfa Antibiotics Hives    And swelling  . Metformin And Related Palpitations     REVIEW OF SYSTEMS: CONSTITUTIONAL: Positive weakness.  No fatigue, fever, chills,  weight gain/loss, headache. EYES: No blurry or double vision. ENT: No tinnitus, postnasal drip, redness or soreness of the oropharynx. RESPIRATORY: No dyspnea, cough, wheeze, hemoptysis. CARDIOVASCULAR: Positive chest pain, palpitations, chest heaviness. Negative syncope. GASTROINTESTINAL: No nausea, vomiting, constipation, diarrhea, abdominal pain. No hematemesis, melena or hematochezia. GENITOURINARY: No dysuria, frequency, hematuria. ENDOCRINE: No polyuria or nocturia. No heat or cold intolerance. HEMATOLOGY: No anemia, bruising, bleeding. INTEGUMENTARY: No rashes, ulcers, lesions. MUSCULOSKELETAL: No pain, arthritis, swelling, gout. NEUROLOGIC: No numbness, tingling, weakness or ataxia. No seizure-type activity. PSYCHIATRIC: No anxiety, depression, insomnia.  PHYSICAL EXAMINATION: VITAL SIGNS: Blood pressure (!) 206/79, pulse (!) 57, temperature 99.1 F (37.3 C), temperature source Oral, resp. rate 19, height 5\' 1"  (1.549 m), weight 61.7 kg (136 lb), SpO2 95 %.  GENERAL: 83 y.o.-year-old female patient, well-developed, well-nourished lying in the bed in no acute  distress.  Pleasant and cooperative.   HEENT: Head atraumatic, normocephalic. Pupils equal, round, reactive to light and accommodation. No scleral icterus. Extraocular muscles intact. Oropharynx is clear. Mucus membranes moist. Right eye ptosis.  NECK: Supple, full range of motion. No JVD, no bruit heard. No cervical lymphadenopathy. CHEST: Normal breath sounds bilaterally. No wheezing, rales, rhonchi or crackles. No use of accessory muscles of respiration.  No reproducible chest wall tenderness.  CARDIOVASCULAR: Irregular. S1, S2 normal. No murmurs, rubs, or gallops appreciated. Cap refill <2 seconds. ABDOMEN: Soft, nontender, nondistended. No rebound, guarding, rigidity. Normoactive bowel sounds present in all four quadrants. No organomegaly or mass. EXTREMITIES: Full range of motion. No pedal edema, cyanosis, or  clubbing. NEUROLOGIC: Cranial nerves II through XII are grossly intact with no focal sensorimotor deficit. Muscle strength 5/5 in all extremities. Sensation intact. Gait not checked. PSYCHIATRIC: The patient is alert and oriented x 3. Normal affect, mood, thought content. SKIN: Warm, dry, and intact without obvious rash, lesion, or ulcer.  LABORATORY PANEL:  CBC  Last Labs    Recent Labs Lab 10/21/16 1236  WBC 8.7  HGB 13.8  HCT 41.4  PLT 233     ----------------------------------------------------------------------------------------------------------------- Chemistries  Last Labs    Recent Labs Lab 10/21/16 1236  NA 136  K 4.0  CL 104  CO2 26  GLUCOSE 101*  BUN 19  CREATININE 0.66  CALCIUM 9.5     ------------------------------------------------------------------------------------------------------------------ Cardiac Enzymes  Last Labs    Recent Labs Lab 10/21/16 1236  TROPONINI <0.03     ------------------------------------------------------------------------------------------------------------------  RADIOLOGY:  Imaging Results (Last 48 hours)  Dg Chest 2 View  Result Date: 10/21/2016 CLINICAL DATA:  Intermittent chest heaviness for 6 days, worse today. EXAM: CHEST  2 VIEW COMPARISON:  PA and lateral chest 03/25/2016 and 06/04/2013. FINDINGS: The lungs are clear. Heart size is normal. No pneumothorax or pleural effusion. Aortic atherosclerosis is noted. No acute bony abnormality. IMPRESSION: No acute disease. Atherosclerosis. Electronically Signed   By: Inge Rise M.D.   On: 10/21/2016 12:52     EKG: Normal sinus rhythm at 70 bpm with normal axis, ?LVH, PVCs and nonspecific ST-T wave changes.   IMPRESSION AND PLAN:  This is a 83 y.o. female with a history of  OA, asthma, GERD, HLD, HTN, DM osteoporosis  now being admitted with:  1. Chest pain, palpitations with PVCs. Rule out ACS - Admit to observation with telemetry  monitoring. - Trend troponins, check lipids and TSH. - Add morphine, nitro, GI cocktail - Continue beta blocker, aspirin.   Statin allergic.  - Check echo - Cardiology consult requested.   2. GERD - Continue Pepcid  3. HTN - Continue Coreg, Diltiazem, Cozaar  4. Asthma - Continue Dulera for Symbicort  Admission status: Observation, telemetry Diet/Nutrition: Heart healthy Fluids: HL DVT Px: Lovenox, SCDs and early ambulation Code Status: Full Disposition Plan: To home in <24 hours  All the records are reviewed and case discussed with ED provider. Management plans discussed with the patient and/or family who express understanding and agree with plan of care.   TOTAL TIME TAKING CARE OF THIS PATIENT: 60 minutes.   Marketia Stallsmith D.O. on 10/21/2016 at 5:00 PM Between 7am to 6pm - Pager - [phone] After 6pm go to [email or web address] - Proofreader [facility] Office [phone] CC: Primary care physician; Hortencia Pilar, MD     Note: This dictation was prepared with Dragon dictation along with smaller phrase technology. Any transcriptional errors that result from  this process are unintentional.

## 2016-10-21 NOTE — ED Triage Notes (Signed)
Patient presents to the ED with intermittent chest heaviness since Saturday that patient reports is worse today, with palpitations, and "fluttering" in her chest.  Patient also reports increasing weakness and shortness of breath.  Patient was seen on Saturday at a hospital in [place] and was admitted observation over night.

## 2016-10-22 ENCOUNTER — Observation Stay (HOSPITAL_BASED_OUTPATIENT_CLINIC_OR_DEPARTMENT_OTHER)
Admit: 2016-10-22 | Discharge: 2016-10-22 | Disposition: A | Discharge status: Home / Self Care | Payer: Medicare Other | Attending: Family Medicine | Admitting: Family Medicine

## 2016-10-22 DIAGNOSIS — R079 Chest pain, unspecified: Secondary | ICD-10-CM | POA: Diagnosis not present

## 2016-10-22 LAB — LIPID PANEL
CHOL/HDL RATIO: 6.1 ratio
Cholesterol: 236 mg/dL — ABNORMAL HIGH (ref 0–200)
HDL: 39 mg/dL — AB (ref >40)
LDL CALC: 161 mg/dL — AB (ref 0–99)
Triglycerides: 182 mg/dL — ABNORMAL HIGH (ref <150)
VLDL: 36 mg/dL (ref 0–40)

## 2016-10-22 LAB — ECHOCARDIOGRAM COMPLETE
Height: 61 in
Weight: 2176 oz

## 2016-10-22 LAB — TROPONIN I: Troponin I: <0.03 ng/mL (ref <0.03)

## 2016-10-22 NOTE — Plan of Care (Signed)
Problem: Safety: Goal: Ability to remain free from injury will improve Outcome: Progressing Pts exit alarm is activated. Pt is encouraged to call out for assistance with activity.

## 2016-10-22 NOTE — Progress Notes (Signed)
*  PRELIMINARY RESULTS* Echocardiogram 2D Echocardiogram has been performed.  Jaime Berger 10/22/2016, 9:36 AM

## 2016-10-22 NOTE — Discharge Summary (Signed)
[place] [facility] [place] NAME: Jaime Berger    MR#:  [id number]  DATE OF BIRTH:  1930/02/28  DATE OF ADMISSION:  10/21/2016 ADMITTING PHYSICIAN: Harvie Bridge, DO  DATE OF DISCHARGE: 10/22/2016 12:15 PM  PRIMARY CARE PHYSICIAN: Hortencia Pilar, MD    ADMISSION DIAGNOSIS:  Shortness of breath [R06.02] Heart palpitations [R00.2] Chest pain, unspecified type [R07.9]  DISCHARGE DIAGNOSIS:  Active Problems:   Chest pain, rule out acute myocardial infarction   SECONDARY DIAGNOSIS:   Past Medical History:  Diagnosis Date  . Arthritis    legs, hands, "everywhere"  . Asthma   . Complication of anesthesia    Pt reports during Colonoscopy at [facility] was "overdosed" on meds and died and was brought back  . Family history of adverse reaction to anesthesia    nausea  . GERD (gastroesophageal reflux disease)   . Heart murmur    "Yrs ago", no mention recently  . Hyperlipidemia   . Hypertension   . Liver enzyme elevation   . Osteoporosis    back  . Prosthetic eye globe    right  . Vertigo     HOSPITAL COURSE:   83 year old female with past medical history of hypertension, hyperlipidemia, osteoarthritis, osteoporosis, vertigo, GERD and anxiety who presented to the hospital due to chest pain.  1. Chest pain-patient was observed overnight in the hospital on telemetry, had 3 sets of cardiac markers checked which were negative. She had no arrhythmias on telemetry. She has a previous history of multifocal PVCs which was not observed on telemetry here.  -her echocardiogram showed normal ejection fraction with no acute wall motion abnormalities. She is being therefore discharged home with follow-up with cardiology as an outpatient to be set up for possible need for loop monitor.  - CP related to anxiety/Grief as pt. Just lost her son a month or so ago.   2. Essential hypertension-patient will resume her carvedilol, Cardizem.  3. History of previous  PVCs-patient will continue her beta blocker and Cardizem as mentioned.  4. GERD-patient will resume her Pepcid.  5. COPD-no acute exacerbation-patient will resume her Symbicort, albuterol inhaler as needed.   DISCHARGE CONDITIONS:   Stable.   CONSULTS OBTAINED:    DRUG ALLERGIES:   Allergies  Allergen Reactions  . Lipitor [Atorvastatin] Other (See Comments)    Irregular heartbeat  . Lovastatin Other (See Comments)    Irregular heartbeat  . Diphenhydramine Hcl Hives    And swelling  . Doxycycline Nausea And Vomiting  . Nsaids Other (See Comments)    Bad gastritis  . Strawberry (Diagnostic) Itching    Only if pt eats a lot per pt  . Sucralfate Other (See Comments)  . Sulfa Antibiotics Hives    And swelling  . Metformin And Related Palpitations    DISCHARGE MEDICATIONS:   Allergies as of 10/22/2016      Reactions   Lipitor [atorvastatin] Other (See Comments)   Irregular heartbeat   Lovastatin Other (See Comments)   Irregular heartbeat   Diphenhydramine Hcl Hives   And swelling   Doxycycline Nausea And Vomiting   Nsaids Other (See Comments)   Bad gastritis   Strawberry (diagnostic) Itching   Only if pt eats a lot per pt   Sucralfate Other (See Comments)   Sulfa Antibiotics Hives   And swelling   Metformin And Related Palpitations      Medication List    STOP taking these medications   acetaminophen 325 MG  tablet Commonly known as:  TYLENOL   nystatin powder Commonly known as:  nystatin     TAKE these medications   albuterol 108 (90 Base) MCG/ACT inhaler Commonly known as:  PROVENTIL HFA;VENTOLIN HFA Inhale into the lungs every 6 (six) hours as needed for wheezing or shortness of breath.   aspirin 325 MG tablet Take 325 mg by mouth every 4 (four) hours as needed.   budesonide-formoterol 160-4.5 MCG/ACT inhaler Commonly known as:  SYMBICORT Inhale 2 puffs into the lungs 2 (two) times daily. Usually only uses in AM   carvedilol 12.5 MG  tablet Commonly known as:  COREG Take 1 tablet (12.5 mg total) by mouth 2 (two) times daily with a meal. What changed:  how much to take   diltiazem 120 MG 24 hr capsule Commonly known as:  DILACOR XR Take 120 mg by mouth daily.   docusate sodium 100 MG capsule Commonly known as:  COLACE Take 1 capsule (100 mg total) by mouth 2 (two) times daily as needed for mild constipation.   famotidine 20 MG tablet Commonly known as:  PEPCID Take 20 mg by mouth daily.   losartan 100 MG tablet Commonly known as:  COZAAR Take 100 mg by mouth daily.   ondansetron 4 MG disintegrating tablet Commonly known as:  ZOFRAN ODT Take 1 tablet (4 mg total) by mouth every 8 (eight) hours as needed for nausea or vomiting. What changed:  how much to take         DISCHARGE INSTRUCTIONS:   DIET:  Cardiac diet  DISCHARGE CONDITION:  Stable  ACTIVITY:  Activity as tolerated  OXYGEN:  Home Oxygen: No.   Oxygen Delivery: room air  DISCHARGE LOCATION:  home   If you experience worsening of your admission symptoms, develop shortness of breath, life threatening emergency, suicidal or homicidal thoughts you must seek medical attention immediately by calling 911 or calling your MD immediately  if symptoms less severe.  You Must read complete instructions/literature along with all the possible adverse reactions/side effects for all the Medicines you take and that have been prescribed to you. Take any new Medicines after you have completely understood and accpet all the possible adverse reactions/side effects.   Please note  You were cared for by a hospitalist during your hospital stay. If you have any questions about your discharge medications or the care you received while you were in the hospital after you are discharged, you can call the unit and asked to speak with the hospitalist on call if the hospitalist that took care of you is not available. Once you are discharged, your primary care  physician will handle any further medical issues. Please note that NO REFILLS for any discharge medications will be authorized once you are discharged, as it is imperative that you return to your primary care physician (or establish a relationship with a primary care physician if you do not have one) for your aftercare needs so that they can reassess your need for medications and monitor your lab values.     Today   No acute chest pain.  No shortness of breath.  No palpitations.  No alarms on tele. Echo showing Normal EF.   VITAL SIGNS:  Blood pressure (!) 141/42, pulse (!) 54, temperature 98.6 F (37 C), resp. rate 14, height 5\' 1"  (1.549 m), weight 61.7 kg (136 lb), SpO2 97 %.  I/O:   Intake/Output Summary (Last 24 hours) at 10/22/16 1451 Last data filed at 10/22/16 0300  Gross per 24 hour  Intake           588.75 ml  Output              500 ml  Net            88.75 ml    PHYSICAL EXAMINATION:  GENERAL:  83 y.o.-year-old patient lying in the bed with no acute distress.  EYES: Pupils equal, round, reactive to light and accommodation. No scleral icterus. Extraocular muscles intact.  HEENT: Head atraumatic, normocephalic. Oropharynx and nasopharynx clear.  NECK:  Supple, no jugular venous distention. No thyroid enlargement, no tenderness.  LUNGS: Normal breath sounds bilaterally, no wheezing, rales,rhonchi. No use of accessory muscles of respiration.  CARDIOVASCULAR: S1, S2 normal. No murmurs, rubs, or gallops.  ABDOMEN: Soft, non-tender, non-distended. Bowel sounds present. No organomegaly or mass.  EXTREMITIES: No pedal edema, cyanosis, or clubbing.  NEUROLOGIC: Cranial nerves II through XII are intact. No focal motor or sensory defecits b/l.  PSYCHIATRIC: The patient is alert and oriented x 3. Good affect.  SKIN: No obvious rash, lesion, or ulcer.   DATA REVIEW:   CBC  Recent Labs Lab 10/21/16 1236  WBC 8.7  HGB 13.8  HCT 41.4  PLT 233    Chemistries   Recent  Labs Lab 10/21/16 1236  NA 136  K 4.0  CL 104  CO2 26  GLUCOSE 101*  BUN 19  CREATININE 0.66  CALCIUM 9.5  MG 2.2    Cardiac Enzymes  Recent Labs Lab 10/22/16 0146  TROPONINI <0.03    RADIOLOGY:  Dg Chest 2 View  Result Date: 10/21/2016 CLINICAL DATA:  Intermittent chest heaviness for 6 days, worse today. EXAM: CHEST  2 VIEW COMPARISON:  PA and lateral chest 03/25/2016 and 06/04/2013. FINDINGS: The lungs are clear. Heart size is normal. No pneumothorax or pleural effusion. Aortic atherosclerosis is noted. No acute bony abnormality. IMPRESSION: No acute disease. Atherosclerosis. Electronically Signed   By: Inge Rise M.D.   On: 10/21/2016 12:52      Management plans discussed with the patient, family and they are in agreement.  CODE STATUS:     Code Status Orders        Start     Ordered   10/21/16 1837  Full code  Continuous     10/21/16 1836    Code Status History    Date Active Date Inactive Code Status Order ID Comments User Context   11/06/2015  1:13 AM 11/08/2015  5:32 PM Full Code [id number]  Harrie Foreman, MD Inpatient      TOTAL TIME TAKING CARE OF THIS PATIENT: 40 minutes.    Henreitta Leber M.D on 10/22/2016 at 2:51 PM  Between 7am to 6pm - Pager - [phone]  After 6pm go to [email or web address] - Proofreader  [facility]  Office  [phone]  CC: Primary care physician; Hortencia Pilar, MD

## 2016-11-10 ENCOUNTER — Encounter: Payer: Self-pay | Admitting: Cardiology

## 2016-11-10 ENCOUNTER — Ambulatory Visit (INDEPENDENT_AMBULATORY_CARE_PROVIDER_SITE_OTHER): Payer: Medicare Other | Admitting: Cardiology

## 2016-11-10 VITALS — BP 138/64 | HR 59 | Ht 60.5 in | Wt 138.8 lb

## 2016-11-10 DIAGNOSIS — I10 Essential (primary) hypertension: Secondary | ICD-10-CM

## 2016-11-10 DIAGNOSIS — R002 Palpitations: Secondary | ICD-10-CM

## 2016-11-10 NOTE — Patient Instructions (Addendum)
Testing/Procedures: Your physician has recommended that you wear a holter monitor. Holter monitors are medical devices that record the heart's electrical activity. Doctors most often use these monitors to diagnose arrhythmias. Arrhythmias are problems with the speed or rhythm of the heartbeat. The monitor is a small, portable device. You can wear one while you do your normal daily activities. This is usually used to diagnose what is causing palpitations/syncope (passing out).    Follow-Up: Your physician wants you to follow-up in: 6 months. You will receive a reminder letter in the mail two months in advance. If you don't receive a letter, please call our office to schedule the follow-up appointment.  It was a pleasure seeing you today here in the office. Please do not hesitate to give [place] a call back if you have any further questions. [place], BSN      Cardiac Event Monitoring A cardiac event monitor is a small recording device that is used to detect abnormal heart rhythms (arrhythmias). The monitor is used to record your heart rhythm when you have symptoms, such as:  Fast heartbeats (palpitations), such as heart racing or fluttering.  Dizziness.  Fainting or light-headedness.  Unexplained weakness. Some monitors are wired to electrodes placed on your chest. Electrodes are flat, sticky disks that attach to your skin. Other monitors may be hand-held or worn on the wrist. The monitor can be worn for up to 30 days. If the monitor is attached to your chest, a technician will prepare your chest for the electrode placement and show you how to work the monitor. Take time to practice using the monitor before you leave the office. Make sure you understand how to send the information from the monitor to your health care provider. In some cases, you may need to use a landline telephone instead of a cell phone. What are the risks? Generally, this device is safe to use, but it  possible that the skin under the electrodes will become irritated. How to use your cardiac event monitor  Wear your monitor at all times, except when you are in water:  Do not let the monitor get wet.  Take the monitor off when you bathe. Do not swim or use a hot tub with it on.  Keep your skin clean. Do not put body lotion or moisturizer on your chest.  Change the electrodes as told by your health care provider or any time they stop sticking to your skin. You may need to use medical tape to keep them on.  Try to put the electrodes in slightly different places on your chest to help prevent skin irritation. They must remain in the area under your left breast and in the upper right section of your chest.  Make sure the monitor is safely clipped to your clothing or in a location close to your body that your health care provider recommends.  Press the button to record as soon as you feel heart-related symptoms, such as:  Dizziness.  Weakness.  Light-headedness.  Palpitations.  Thumping or pounding in your chest.  Shortness of breath.  Unexplained weakness.  Keep a diary of your activities, such as walking, doing chores, and taking medicine. It is very important to note what you were doing when you pushed the button to record your symptoms. This will help your health care provider determine what might be contributing to your symptoms.  Send the recorded information as recommended by your health care provider. It may take some time  for your health care provider to process the results.  Change the batteries as told by your health care provider.  Keep electronic devices away from your monitor. This includes:  Tablets.  MP3 players.  Cell phones.  While wearing your monitor you should avoid:  Electric blankets.  Armed forces operational officer.  Electric toothbrushes.  Microwave ovens.  Magnets.  Metal detectors. Get help right away if:  You have chest pain.  You have extreme  difficulty breathing or shortness of breath.  You develop a very fast heartbeat that persists.  You develop dizziness that does not go away.  You faint or constantly feel like you are about to faint. Summary  A cardiac event monitor is a small recording device that is used to help detect abnormal heart rhythms (arrhythmias).  The monitor is used to record your heart rhythm when you have heart-related symptoms.  Make sure you understand how to send the information from the monitor to your health care provider.  It is important to press the button on the monitor when you have any heart-related symptoms.  Keep a diary of your activities, such as walking, doing chores, and taking medicine. It is very important to note what you were doing when you pushed the button to record your symptoms. This will help your health care provider learn what might be causing your symptoms. This information is not intended to replace advice given to you by your health care provider. Make sure you discuss any questions you have with your health care provider. Document Released: 03/29/2008 Document Revised: 06/04/2016 Document Reviewed: 06/04/2016 Elsevier Interactive Patient Education  2017 [employer].

## 2016-11-10 NOTE — Progress Notes (Signed)
Cardiology Office Note   Date:  11/10/2016   ID:  Jaime Berger, DOB 11-23-1929, MRN [id number]  Referring Doctor:  Hortencia Pilar, MD   Cardiologist:   Wende Bushy, MD   Reason for consultation:  Chief Complaint  Patient presents with  . OTHER    F/u hospital c/o heart palpitations. Meds reviewed verbally with pt.      History of Present Illness: Jaime Berger is a 83 y.o. female who is being seen today for the evaluation of PVCs at the request of Hortencia Pilar, MD.  Patient was recently discharged 10/22/2016 for chest pain. She was ruled out for ACS with 3 negative biomarkers. Chest pain was thought to be related to anxiety and grief since patient just lost her son a month ago. She was noted to have a history of PVCs, saw Dr. Saralyn Pilar 2016. She was on carvedilol for that. LVEF in 2017 was normal.  Per patient, she reports that what prompted her to go to the ER that last time was sudden onset of palpitations associated with some chest tightness. This while she was working outside in [employer]. It was more than her usual episode of palpitation, more moderate in intensity and was lasting a little bit longer and therefore she presented to the ER to get evaluated. When they increased her dose of carvedilol she felt an improvement in her symptoms. She continues to have daily short episodes of palpitations. She says that she's been having these for a long time now since the 80s. She has had no recurrence of chest discomfort, no chest pain.  She continues to do some chores in the house and outside as well, what limits her physical activity is body pains and joint pains. Patient denies PND, orthopnea, edema.  ROS:  Please see the history of present illness. Aside from mentioned under HPI, all other systems are reviewed and negative.     Past Medical History:  Diagnosis Date  . Arthritis    legs, hands, "everywhere"  . Asthma   . Carotid artery occlusion   .  Complication of anesthesia    Pt reports during Colonoscopy at [facility] was "overdosed" on meds and died and was brought back  . Family history of adverse reaction to anesthesia    nausea  . GERD (gastroesophageal reflux disease)   . Heart murmur    "Yrs ago", no mention recently  . Hyperlipidemia   . Hypertension   . Liver enzyme elevation   . Osteoporosis    back  . Prosthetic eye globe    right  . Vertigo     Past Surgical History:  Procedure Laterality Date  . ABDOMINAL HYSTERECTOMY    . APPENDECTOMY    . BREAST SURGERY Right    lumpectomy  . CHOLECYSTECTOMY    . ESOPHAGOGASTRODUODENOSCOPY N/A 12/04/2014   Procedure: ESOPHAGOGASTRODUODENOSCOPY (EGD);  Surgeon: Lucilla Lame, MD;  Location: [place];  Service: Gastroenterology;  Laterality: N/A;  REFERRING DR Hortencia Pilar  . EYE SURGERY Right 1975    prosthesis after melanoma  . HAND / FINGER LESION EXCISION     "nodules"     reports that she has never smoked. She has never used smokeless tobacco. She reports that she does not drink alcohol or use drugs.   family history is not on file.   Outpatient Medications Prior to Visit  Medication Sig Dispense Refill  . albuterol (PROVENTIL HFA;VENTOLIN HFA) 108 (90 BASE) MCG/ACT inhaler Inhale into the  lungs every 6 (six) hours as needed for wheezing or shortness of breath.    Marland Kitchen aspirin 325 MG tablet Take 325 mg by mouth every 4 (four) hours as needed.    . budesonide-formoterol (SYMBICORT) 160-4.5 MCG/ACT inhaler Inhale 2 puffs into the lungs 2 (two) times daily. Usually only uses in AM    . carvedilol (COREG) 12.5 MG tablet Take 1 tablet (12.5 mg total) by mouth 2 (two) times daily with a meal. (Patient taking differently: Take 6.25 mg by mouth 2 (two) times daily with a meal. ) 60 tablet 0  . diltiazem (DILACOR XR) 120 MG 24 hr capsule Take 120 mg by mouth daily.    Marland Kitchen docusate sodium (COLACE) 100 MG capsule Take 1 capsule (100 mg total) by mouth 2 (two) times daily as  needed for mild constipation. 180 capsule 3  . famotidine (PEPCID) 20 MG tablet Take 20 mg by mouth daily.    Marland Kitchen losartan (COZAAR) 100 MG tablet Take 100 mg by mouth daily.     . ondansetron (ZOFRAN ODT) 4 MG disintegrating tablet Take 1 tablet (4 mg total) by mouth every 8 (eight) hours as needed for nausea or vomiting. (Patient taking differently: Take 8 mg by mouth every 8 (eight) hours as needed for nausea or vomiting. ) 20 tablet 2   No facility-administered medications prior to visit.      Allergies: Lipitor [atorvastatin]; Lovastatin; Diphenhydramine hcl; Doxycycline; Nsaids; Strawberry (diagnostic); Sucralfate; Sulfa antibiotics; and Metformin and related    PHYSICAL EXAM: VS:  BP 138/64 (BP Location: Right Arm, Patient Position: Sitting, Cuff Size: Normal)   Pulse (!) 59   Ht 5' 0.5" (1.537 m)   Wt 138 lb 12 oz (62.9 kg)   BMI 26.65 kg/m  , Body mass index is 26.65 kg/m. Wt Readings from Last 3 Encounters:  11/10/16 138 lb 12 oz (62.9 kg)  10/21/16 136 lb (61.7 kg)  03/25/16 136 lb (61.7 kg)    GENERAL:  well developed, well nourished, not in acute distress HEENT: normocephalic, pink conjunctivae, anicteric sclerae, no xanthelasma, normal dentition, oropharynx clear NECK:  no neck vein engorgement, JVP normal, no hepatojugular reflux, carotid upstroke brisk and symmetric, no bruit, no thyromegaly, no lymphadenopathy LUNGS:  good respiratory effort, clear to auscultation bilaterally CV:  PMI not displaced, no thrills, no lifts, S1 and S2 within normal limits, no palpable S3 or S4, no murmurs, no rubs, no gallops ABD:  Soft, nontender, nondistended, normoactive bowel sounds, no abdominal aortic bruit, no hepatomegaly, no splenomegaly MS: nontender back, no kyphosis, no scoliosis, no joint deformities EXT:  2+ DP/PT pulses, no edema, no varicosities, no cyanosis, no clubbing SKIN: warm, nondiaphoretic, normal turgor, no ulcers NEUROPSYCH: alert, oriented to person, place, and  time, sensory/motor grossly intact, normal mood, appropriate affect  Recent Labs: 01/20/2016: ALT 16 10/21/2016: BUN 19; Creatinine, Ser 0.66; Hemoglobin 13.8; Magnesium 2.2; Platelets 233; Potassium 4.0; Sodium 136; TSH 2.624   Lipid Panel    Component Value Date/Time   CHOL 236 (H) 10/22/2016 0146   TRIG 182 (H) 10/22/2016 0146   HDL 39 (L) 10/22/2016 0146   CHOLHDL 6.1 10/22/2016 0146   VLDL 36 10/22/2016 0146   LDLCALC 161 (H) 10/22/2016 0146     Other studies Reviewed:  EKG:  The ekg from 11/10/2016 was personally reviewed by me and it revealed sinus rhythm, 59 BPM.  Additional studies/ records that were reviewed personally reviewed by me today include:  Echo 10/22/2016:  - Left ventricle: The  cavity size was normal. There was mild   concentric hypertrophy. Systolic function was normal. The   estimated ejection fraction was in the range of 60% to 65%. Wall   motion was normal; there were no regional wall motion   abnormalities. Doppler parameters are consistent with abnormal   left ventricular relaxation (grade 1 diastolic dysfunction). - Mitral valve: There was mild regurgitation. - Left atrium: The atrium was normal in size. - Right ventricle: Systolic function was normal. - Pulmonary arteries: Systolic pressure was within the normal   range.  ASSESSMENT AND PLAN:  Palpitations  Review of records shows she has a history of PVCs that were thought to be asymptomatic. LVEF from recent echo was normal. Recommend Holter monitor to complete the evaluation.  Continue current regimen for now.  Hypertension BP is well controlled. Continue monitoring BP. Continue current medical therapy and lifestyle changes. Recommend to take only a baby aspirin instead of the full dose aspirin daily.   Current medicines are reviewed at length with the patient today.  The patient does not have concerns regarding medicines.  Labs/ tests ordered today include:  Orders Placed This  Encounter  Procedures  . Holter monitor - 24 hour    I had a lengthy and detailed discussion with the patient regarding diagnoses, prognosis, diagnostic options.   Disposition:   FU with Cardiology in 6 months  Thank you for this consultation. We will forwarding this consultation to referring physician.   I spent at least 45 minutes with the patient today and more than 50% of the time was spent counseling the patient and coordinating care.    Signed, Wende Bushy, MD  11/10/2016 1:51 PM    [place]  This note was generated in part with voice recognition software and I apologize for any typographical errors that were not detected and corrected.

## 2016-11-11 NOTE — Addendum Note (Signed)
Addended by: Anselm Pancoast on: 11/11/2016 10:32 AM   Modules accepted: Orders

## 2016-11-21 ENCOUNTER — Ambulatory Visit (INDEPENDENT_AMBULATORY_CARE_PROVIDER_SITE_OTHER): Payer: Medicare Other

## 2016-11-21 DIAGNOSIS — R002 Palpitations: Secondary | ICD-10-CM | POA: Diagnosis not present

## 2016-11-25 ENCOUNTER — Ambulatory Visit: Payer: Medicare Other | Attending: Cardiology

## 2016-11-25 DIAGNOSIS — R002 Palpitations: Secondary | ICD-10-CM | POA: Diagnosis present

## 2016-12-06 ENCOUNTER — Telehealth: Payer: Self-pay | Admitting: Cardiology

## 2016-12-06 NOTE — Telephone Encounter (Signed)
Reviewed that physician did recommend that they come in to review results and reviewed current appointment but that day was not good for her. Let her know that I would have someone give her a call to get that rescheduled and she was appreciative for the call back.

## 2016-12-06 NOTE — Telephone Encounter (Signed)
Left voicemail with appointment date and instructions to call back.

## 2016-12-06 NOTE — Telephone Encounter (Signed)
Lmov for patient daughter to call back and reschedule appt.

## 2016-12-06 NOTE — Telephone Encounter (Signed)
Spoke with patients daughter and let her know that report is pending. Results were received today and will forward to Dr. Saunders Revel for his review. She was appreciative for the call and her only other question was if she would need treatment or follow up. Let her know that I would call back with those official results when available.

## 2016-12-06 NOTE — Telephone Encounter (Signed)
Pt daughter would like monitor results. Please call.

## 2016-12-07 NOTE — Telephone Encounter (Signed)
Spoke to pt daughter  They are coming in on 01/20/17 to see Ignacia Bayley  Nothing further needed.

## 2016-12-15 ENCOUNTER — Ambulatory Visit: Payer: Medicare Other | Admitting: Nurse Practitioner

## 2016-12-21 ENCOUNTER — Ambulatory Visit: Payer: Medicare Other | Admitting: Nurse Practitioner

## 2017-06-16 ENCOUNTER — Other Ambulatory Visit: Payer: Self-pay

## 2017-06-16 ENCOUNTER — Encounter: Payer: Self-pay | Admitting: Emergency Medicine

## 2017-06-16 ENCOUNTER — Emergency Department
Admission: EM | Admit: 2017-06-16 | Discharge: 2017-06-16 | Disposition: A | Discharge status: Home / Self Care | Payer: Medicare Other | Attending: Emergency Medicine | Admitting: Emergency Medicine

## 2017-06-16 ENCOUNTER — Emergency Department: Payer: Medicare Other

## 2017-06-16 DIAGNOSIS — J45909 Unspecified asthma, uncomplicated: Secondary | ICD-10-CM | POA: Diagnosis not present

## 2017-06-16 DIAGNOSIS — Z79899 Other long term (current) drug therapy: Secondary | ICD-10-CM | POA: Diagnosis not present

## 2017-06-16 DIAGNOSIS — Z7982 Long term (current) use of aspirin: Secondary | ICD-10-CM | POA: Diagnosis not present

## 2017-06-16 DIAGNOSIS — R1013 Epigastric pain: Secondary | ICD-10-CM | POA: Insufficient documentation

## 2017-06-16 DIAGNOSIS — E119 Type 2 diabetes mellitus without complications: Secondary | ICD-10-CM | POA: Insufficient documentation

## 2017-06-16 DIAGNOSIS — K2970 Gastritis, unspecified, without bleeding: Secondary | ICD-10-CM | POA: Insufficient documentation

## 2017-06-16 DIAGNOSIS — I10 Essential (primary) hypertension: Secondary | ICD-10-CM | POA: Diagnosis not present

## 2017-06-16 LAB — CBC
HCT: 38.1 % (ref 35.0–47.0)
HEMOGLOBIN: 12.9 g/dL (ref 12.0–16.0)
MCH: 31.9 pg (ref 26.0–34.0)
MCHC: 33.8 g/dL (ref 32.0–36.0)
MCV: 94.2 fL (ref 80.0–100.0)
Platelets: 225 10*3/uL (ref 150–440)
RBC: 4.05 MIL/uL (ref 3.80–5.20)
RDW: 14.1 % (ref 11.5–14.5)
WBC: 8.7 10*3/uL (ref 3.6–11.0)

## 2017-06-16 LAB — COMPREHENSIVE METABOLIC PANEL
ALT: 16 U/L (ref 14–54)
ANION GAP: 6 (ref 5–15)
AST: 27 U/L (ref 15–41)
Albumin: 3.8 g/dL (ref 3.5–5.0)
Alkaline Phosphatase: 50 U/L (ref 38–126)
BUN: 20 mg/dL (ref 6–20)
CHLORIDE: 103 mmol/L (ref 101–111)
CO2: 27 mmol/L (ref 22–32)
Calcium: 9.7 mg/dL (ref 8.9–10.3)
Creatinine, Ser: 0.61 mg/dL (ref 0.44–1.00)
GFR calc Af Amer: >60 mL/min (ref >60)
GFR calc non Af Amer: >60 mL/min (ref >60)
Glucose, Bld: 118 mg/dL — ABNORMAL HIGH (ref 65–99)
POTASSIUM: 4.1 mmol/L (ref 3.5–5.1)
SODIUM: 136 mmol/L (ref 135–145)
Total Bilirubin: 0.7 mg/dL (ref 0.3–1.2)
Total Protein: 7.1 g/dL (ref 6.5–8.1)

## 2017-06-16 LAB — LIPASE, BLOOD: LIPASE: 36 U/L (ref 11–51)

## 2017-06-16 MED ORDER — ONDANSETRON HCL 4 MG/2ML IJ SOLN
4.0000 mg | Freq: Once | INTRAMUSCULAR | Status: AC
  Administered 2017-06-16: 4 mg via INTRAVENOUS
  Filled 2017-06-16: qty 2

## 2017-06-16 MED ORDER — SUCRALFATE 1 GM/10ML PO SUSP: 1.0000 g | Freq: Four times a day (QID) | ORAL | 1 refills | Status: DC

## 2017-06-16 MED ORDER — IOPAMIDOL (ISOVUE-300) INJECTION 61%
30.0000 mL | Freq: Once | INTRAVENOUS | Status: AC | PRN
  Administered 2017-06-16: 30 mL via ORAL

## 2017-06-16 MED ORDER — PANTOPRAZOLE SODIUM 40 MG PO PACK: 20.0000 mg | PACK | Freq: Every day | ORAL | 0 refills | Status: DC

## 2017-06-16 MED ORDER — IOPAMIDOL (ISOVUE-300) INJECTION 61%
100.0000 mL | Freq: Once | INTRAVENOUS | Status: AC | PRN
  Administered 2017-06-16: 100 mL via INTRAVENOUS

## 2017-06-16 NOTE — ED Provider Notes (Signed)
[facility] Emergency Department Provider Note  ____________________________________________   I have reviewed the triage vital signs and the nursing notes.   HISTORY  Chief Complaint Abdominal Pain   History limited by: Not Limited   HPI Jaime Berger is a 83 y.o. female who presents to the emergency department today because of abdominal pain.  LOCATION:epigastrium DURATION:1 day TIMING: intermittent SEVERITY: severe QUALITY: sharp CONTEXT: patient states she has a history of gastritis and pancreatitis. The pain reminded her of when she had pancreatitis in the past. The patient denies any unusual ingestions.  MODIFYING FACTORS: better when staying still and resting ASSOCIATED SYMPTOMS: has had some nausea. No vomiting. No change in stooling. No shortness of breath. No fevers.  Per medical record review patient has a history of gerd, pancreatitis.   Past Medical History:  Diagnosis Date  . Arthritis    legs, hands, "everywhere"  . Asthma   . Carotid artery occlusion   . Complication of anesthesia    Pt reports during Colonoscopy at [facility] was "overdosed" on meds and died and was brought back  . Family history of adverse reaction to anesthesia    nausea  . GERD (gastroesophageal reflux disease)   . Heart murmur    "Yrs ago", no mention recently  . Hyperlipidemia   . Hypertension   . Liver enzyme elevation   . Osteoporosis    back  . Prosthetic eye globe    right  . Vertigo     Patient Active Problem List   Diagnosis Date Noted  . Chest pain, rule out acute myocardial infarction 10/21/2016  . Compression fracture of vertebral column ([place]) 12/02/2015  . Pancreatitis 11/05/2015  . Awareness of heartbeats 09/19/2015  . AG (atrophic gastritis) 03/03/2015  . Blind one eye 01/01/2015  . Drug intolerance 12/03/2014  . HLD (hyperlipidemia) 03/06/2012  . Pseudoaphakia 02/17/2012  . Airway hyperreactivity 01/03/2011  . Controlled diabetes  mellitus type II without complication ([place]) 87/56/4332  . Acid reflux 01/03/2011  . Benign essential HTN 01/03/2011  . 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 OP (osteoporosis) 01/03/2011  . Syncope and collapse 01/03/2011  . Malignant melanoma of skin ([place]) 01/03/2011  . Controlled type 2 diabetes mellitus without complication ([place]) 95/18/8416    Past Surgical History:  Procedure Laterality Date  . ABDOMINAL HYSTERECTOMY    . APPENDECTOMY    . BREAST SURGERY Right    lumpectomy  . CHOLECYSTECTOMY    . ESOPHAGOGASTRODUODENOSCOPY N/A 12/04/2014   Procedure: ESOPHAGOGASTRODUODENOSCOPY (EGD);  Surgeon: Lucilla Lame, MD;  Location: [place];  Service: Gastroenterology;  Laterality: N/A;  REFERRING DR Hortencia Pilar  . EYE SURGERY Right 1975    prosthesis after melanoma  . HAND / FINGER LESION EXCISION     "nodules"    Prior to Admission medications   Medication Sig Start Date End Date Taking? Authorizing Provider  albuterol (PROVENTIL HFA;VENTOLIN HFA) 108 (90 BASE) MCG/ACT inhaler Inhale into the lungs every 6 (six) hours as needed for wheezing or shortness of breath.    [provider]  aspirin 325 MG tablet Take 325 mg by mouth every 4 (four) hours as needed.    [provider]  budesonide-formoterol (SYMBICORT) 160-4.5 MCG/ACT inhaler Inhale 2 puffs into the lungs 2 (two) times daily. Usually only uses in AM    [provider]  carvedilol (COREG) 12.5 MG tablet Take 1 tablet (12.5 mg total) by mouth 2 (two) times daily with a meal. Patient taking differently:  Take 6.25 mg by mouth 2 (two) times daily with a meal.  11/08/15   Gouru, Aruna, MD  diltiazem (DILACOR XR) 120 MG 24 hr capsule Take 120 mg by mouth daily.    [provider]  docusate sodium (COLACE) 100 MG capsule Take 1 capsule (100 mg total) by mouth 2 (two) times daily as needed for mild constipation. 12/17/15   Lucilla Lame, MD  famotidine (PEPCID) 20 MG tablet Take 20 mg  by mouth daily.    [provider]  losartan (COZAAR) 100 MG tablet Take 100 mg by mouth daily.     [provider]  ondansetron (ZOFRAN ODT) 4 MG disintegrating tablet Take 1 tablet (4 mg total) by mouth every 8 (eight) hours as needed for nausea or vomiting. Patient taking differently: Take 8 mg by mouth every 8 (eight) hours as needed for nausea or vomiting.  02/16/16   Lucilla Lame, MD    Allergies Lipitor [atorvastatin]; Lovastatin; Diphenhydramine hcl; Doxycycline; Nsaids; Strawberry (diagnostic); Sucralfate; Sulfa antibiotics; and Metformin and related  History reviewed. No pertinent family history.  Social History Social History   Tobacco Use  . Smoking status: Never Smoker  . Smokeless tobacco: Never Used  Substance Use Topics  . Alcohol use: No  . Drug use: No    Review of Systems Constitutional: No fever/chills Eyes: No visual changes. ENT: No sore throat. Cardiovascular: Denies chest pain. Respiratory: Denies shortness of breath. Gastrointestinal: Positive for epigastric pain. Genitourinary: Negative for dysuria. Musculoskeletal: Negative for back pain. Skin: Negative for rash. Neurological: Negative for headaches, focal weakness or numbness.  ____________________________________________   PHYSICAL EXAM:  VITAL SIGNS: ED Triage Vitals  Enc Vitals Group     BP 06/16/17 1434 (!) 143/81     Pulse Rate 06/16/17 1434 60     Resp 06/16/17 1434 18     Temp 06/16/17 1434 98.5 F (36.9 C)     Temp Source 06/16/17 1434 Oral     SpO2 06/16/17 1434 100 %     Weight 06/16/17 1435 138 lb (62.6 kg)     Height 06/16/17 1435 5\' 1"  (1.549 m)     Head Circumference --      Peak Flow --      Pain Score 06/16/17 1434 3   Constitutional: Alert and oriented. Well appearing and in no distress. Eyes: Conjunctivae are normal.  ENT   Head: Normocephalic and atraumatic.   Nose: No congestion/rhinnorhea.   Mouth/Throat: Mucous membranes are  moist.   Neck: No stridor. Hematological/Lymphatic/Immunilogical: No cervical lymphadenopathy. Cardiovascular: Normal rate, regular rhythm.  No murmurs, rubs, or gallops.  Respiratory: Normal respiratory effort without tachypnea nor retractions. Breath sounds are clear and equal bilaterally. No wheezes/rales/rhonchi. Gastrointestinal: Soft and tender to palpation in the epigastric region. No rebound. No guarding.  Genitourinary: Deferred Musculoskeletal: Normal range of motion in all extremities. No lower extremity edema. Neurologic:  Normal speech and language. No gross focal neurologic deficits are appreciated.  Skin:  Skin is warm, dry and intact. No rash noted. Psychiatric: Mood and affect are normal. Speech and behavior are normal. Patient exhibits appropriate insight and judgment.  ____________________________________________    LABS (pertinent positives/negatives)  Lipase 36 CBC wnl CMP glu 118 otherwise wnl  ____________________________________________   EKG  None  ____________________________________________    RADIOLOGY  CT abd.pel No acute findings  ____________________________________________   PROCEDURES  Procedures  ____________________________________________   INITIAL IMPRESSION / ASSESSMENT AND PLAN / ED COURSE  Pertinent labs & imaging results  that were available during my care of the patient were reviewed by me and considered in my medical decision making (see chart for details).  Patient presented to the emergency department today because of concern for abdominal pain and possible pancreatitis. ddx would include pancreatitis, gastritis, duodenitis, biliary stone amongst other etiologies. Patient with minimal tenderness on exam. Blood work without concerning elevation of lipase or leukocytosis. CT scan was obtained which did not show any sign of volvulus or other concerning intraabdominal pathology. Did discuss findings of cirrhosis with  patient and family which they were aware of. Will plan on treating for gastritis.    ____________________________________________   FINAL CLINICAL IMPRESSION(S) / ED DIAGNOSES  Final diagnoses:  Epigastric pain  Gastritis, presence of bleeding unspecified, unspecified chronicity, unspecified gastritis type     Note: This dictation was prepared with Dragon dictation. Any transcriptional errors that result from this process are unintentional     Nance Pear, MD 06/16/17 2144

## 2017-06-16 NOTE — ED Notes (Signed)
ED Provider at bedside. 

## 2017-06-16 NOTE — Discharge Instructions (Signed)
Please seek medical attention for any high fevers, chest pain, shortness of breath, change in behavior, persistent vomiting, bloody stool or any other new or concerning symptoms.  

## 2017-06-16 NOTE — ED Notes (Signed)

## 2017-06-16 NOTE — ED Notes (Signed)
Pt in CT at this time.

## 2017-06-16 NOTE — ED Triage Notes (Signed)
Pt to ed with c/o abd pain and n/v that started yesterday.  Pt states worse today.  Pt reports hx of pancreatitis, and states this feels the same.

## 2017-07-08 ENCOUNTER — Other Ambulatory Visit: Payer: Self-pay

## 2017-07-08 ENCOUNTER — Ambulatory Visit: Payer: Medicare Other

## 2017-07-08 ENCOUNTER — Encounter: Payer: Self-pay | Admitting: Emergency Medicine

## 2017-07-08 ENCOUNTER — Ambulatory Visit
Admission: EM | Admit: 2017-07-08 | Discharge: 2017-07-08 | Disposition: A | Discharge status: Home / Self Care | Payer: Medicare Other | Attending: Emergency Medicine | Admitting: Emergency Medicine

## 2017-07-08 DIAGNOSIS — K219 Gastro-esophageal reflux disease without esophagitis: Secondary | ICD-10-CM | POA: Diagnosis not present

## 2017-07-08 DIAGNOSIS — J45909 Unspecified asthma, uncomplicated: Secondary | ICD-10-CM | POA: Diagnosis present

## 2017-07-08 DIAGNOSIS — Z91018 Allergy to other foods: Secondary | ICD-10-CM | POA: Diagnosis not present

## 2017-07-08 DIAGNOSIS — Z9049 Acquired absence of other specified parts of digestive tract: Secondary | ICD-10-CM | POA: Insufficient documentation

## 2017-07-08 DIAGNOSIS — J069 Acute upper respiratory infection, unspecified: Secondary | ICD-10-CM | POA: Diagnosis not present

## 2017-07-08 DIAGNOSIS — E785 Hyperlipidemia, unspecified: Secondary | ICD-10-CM | POA: Diagnosis not present

## 2017-07-08 DIAGNOSIS — Z882 Allergy status to sulfonamides status: Secondary | ICD-10-CM | POA: Insufficient documentation

## 2017-07-08 DIAGNOSIS — E119 Type 2 diabetes mellitus without complications: Secondary | ICD-10-CM | POA: Diagnosis not present

## 2017-07-08 DIAGNOSIS — I6529 Occlusion and stenosis of unspecified carotid artery: Secondary | ICD-10-CM | POA: Insufficient documentation

## 2017-07-08 DIAGNOSIS — Z888 Allergy status to other drugs, medicaments and biological substances status: Secondary | ICD-10-CM | POA: Insufficient documentation

## 2017-07-08 DIAGNOSIS — Z886 Allergy status to analgesic agent status: Secondary | ICD-10-CM | POA: Diagnosis not present

## 2017-07-08 DIAGNOSIS — Z7952 Long term (current) use of systemic steroids: Secondary | ICD-10-CM | POA: Diagnosis not present

## 2017-07-08 DIAGNOSIS — Z79899 Other long term (current) drug therapy: Secondary | ICD-10-CM | POA: Insufficient documentation

## 2017-07-08 DIAGNOSIS — R0602 Shortness of breath: Secondary | ICD-10-CM | POA: Diagnosis not present

## 2017-07-08 DIAGNOSIS — Z9889 Other specified postprocedural states: Secondary | ICD-10-CM | POA: Insufficient documentation

## 2017-07-08 MED ORDER — METHYLPREDNISOLONE 4 MG PO TBPK: ORAL_TABLET | ORAL | 0 refills | Status: DC

## 2017-07-08 MED ORDER — ONDANSETRON 4 MG PO TBDP: 8.0000 mg | ORAL_TABLET | Freq: Two times a day (BID) | ORAL | 0 refills | Status: DC

## 2017-07-08 MED ORDER — AMOXICILLIN-POT CLAVULANATE 875-125 MG PO TABS: 1.0000 | ORAL_TABLET | Freq: Two times a day (BID) | ORAL | 0 refills | Status: DC

## 2017-07-08 NOTE — ED Provider Notes (Signed)
[facility]    CSN: [id number] Arrival date & time: 07/08/17  4268     History   Chief Complaint Chief Complaint  Patient presents with  . Shortness of Breath  . Sinus Problem    HPI Jaime Berger is a 84 y.o. female.   HPI  6-year-old female presents with congestion and pressure for the last couple of days.  She has shortness of breath that started this morning.  She denies any chest pain and she was lightheaded this morning.  She has a history of asthma and has used her inhaler. She has a history of hyper reactivity of the airway.  He is afebrile.  Not report any fever or chills.  O2 sats are 98% on room air.     Past Medical History:  Diagnosis Date  . Arthritis    legs, hands, "everywhere"  . Asthma   . Carotid artery occlusion   . Complication of anesthesia    Pt reports during Colonoscopy at [facility] was "overdosed" on meds and died and was brought back  . Family history of adverse reaction to anesthesia    nausea  . GERD (gastroesophageal reflux disease)   . Heart murmur    "Yrs ago", no mention recently  . Hyperlipidemia   . Hypertension   . Liver enzyme elevation   . Osteoporosis    back  . Prosthetic eye globe    right  . Vertigo     Patient Active Problem List   Diagnosis Date Noted  . Chest pain, rule out acute myocardial infarction 10/21/2016  . Compression fracture of vertebral column ([place]) 12/02/2015  . Pancreatitis 11/05/2015  . Awareness of heartbeats 09/19/2015  . AG (atrophic gastritis) 03/03/2015  . Blind one eye 01/01/2015  . Drug intolerance 12/03/2014  . HLD (hyperlipidemia) 03/06/2012  . Pseudoaphakia 02/17/2012  . Airway hyperreactivity 01/03/2011  . Controlled diabetes mellitus type II without complication ([place]) 34/19/6222  . Acid reflux 01/03/2011  . Benign essential HTN 01/03/2011  . 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 OP (osteoporosis) 01/03/2011  . Syncope and collapse 01/03/2011  . Malignant  melanoma of skin ([place]) 01/03/2011  . Controlled type 2 diabetes mellitus without complication ([place]) 97/98/9211    Past Surgical History:  Procedure Laterality Date  . ABDOMINAL HYSTERECTOMY    . APPENDECTOMY    . BREAST SURGERY Right    lumpectomy  . CHOLECYSTECTOMY    . ESOPHAGOGASTRODUODENOSCOPY N/A 12/04/2014   Procedure: ESOPHAGOGASTRODUODENOSCOPY (EGD);  Surgeon: Lucilla Lame, MD;  Location: [facility];  Service: Gastroenterology;  Laterality: N/A;  REFERRING DR Hortencia Pilar  . EYE SURGERY Right 1975    prosthesis after melanoma  . HAND / FINGER LESION EXCISION     "nodules"    OB History    No data available       Home Medications    Prior to Admission medications   Medication Sig Start Date End Date Taking? Authorizing Provider  albuterol (PROVENTIL HFA;VENTOLIN HFA) 108 (90 BASE) MCG/ACT inhaler Inhale into the lungs every 6 (six) hours as needed for wheezing or shortness of breath.   Yes [provider]  budesonide-formoterol (SYMBICORT) 160-4.5 MCG/ACT inhaler Inhale 2 puffs into the lungs 2 (two) times daily. Usually only uses in AM   Yes [provider]  carvedilol (COREG) 12.5 MG tablet Take 1 tablet (12.5 mg total) by mouth 2 (two) times daily with a meal. 11/08/15  Yes Gouru, Aruna, MD  diltiazem (DILACOR XR) 120  MG 24 hr capsule Take 120 mg by mouth daily.   Yes [provider]  losartan (COZAAR) 100 MG tablet Take 100 mg by mouth daily.    Yes [provider]  pantoprazole sodium (PROTONIX) 40 mg/20 mL PACK Take 10 mLs (20 mg total) by mouth daily. 06/16/17  Yes Nance Pear, MD  amoxicillin-clavulanate (AUGMENTIN) 875-125 MG tablet Take 1 tablet by mouth every 12 (twelve) hours. 07/08/17   Lorin Picket, PA-C  docusate sodium (COLACE) 100 MG capsule Take 1 capsule (100 mg total) by mouth 2 (two) times daily as needed for mild constipation. 12/17/15   Lucilla Lame, MD  methylPREDNISolone (MEDROL DOSEPAK) 4 MG TBPK  tablet Take per package instructions 07/08/17   Crecencio Mc P, PA-C  ondansetron (ZOFRAN-ODT) 4 MG disintegrating tablet Take 2 tablets (8 mg total) by mouth 2 (two) times daily. 07/08/17   Lorin Picket, PA-C  sucralfate (CARAFATE) 1 GM/10ML suspension Take 10 mLs (1 g total) by mouth 4 (four) times daily. 06/16/17 06/16/18  Nance Pear, MD    Family History History reviewed. No pertinent family history.  Social History Social History   Tobacco Use  . Smoking status: Never Smoker  . Smokeless tobacco: Never Used  Substance Use Topics  . Alcohol use: No  . Drug use: No     Allergies   Lipitor [atorvastatin]; Lovastatin; Diphenhydramine hcl; Doxycycline; Nsaids; Strawberry (diagnostic); Sucralfate; Sulfa antibiotics; and Metformin and related   Review of Systems Review of Systems  Constitutional: Positive for activity change. Negative for chills, fatigue and fever.  HENT: Positive for congestion and postnasal drip.   Respiratory: Positive for cough and shortness of breath.   All other systems reviewed and are negative.    Physical Exam Triage Vital Signs ED Triage Vitals  Enc Vitals Group     BP 07/08/17 1022 (!) 166/67     Pulse Rate 07/08/17 1022 60     Resp 07/08/17 1022 16     Temp 07/08/17 1022 98.7 F (37.1 C)     Temp Source 07/08/17 1022 Oral     SpO2 07/08/17 1022 98 %     Weight 07/08/17 1019 138 lb (62.6 kg)     Height 07/08/17 1019 5\' 3"  (1.6 m)     Head Circumference --      Peak Flow --      Pain Score 07/08/17 1019 0     Pain Loc --      Pain Edu? --      Excl. in [place]? --    No data found.  Updated Vital Signs BP (!) 166/67 (BP Location: Left Arm)   Pulse 60   Temp 98.7 F (37.1 C) (Oral)   Resp 16   Ht 5\' 3"  (1.6 m)   Wt 138 lb (62.6 kg)   SpO2 98%   BMI 24.45 kg/m   Visual Acuity Right Eye Distance:   Left Eye Distance:   Bilateral Distance:    Right Eye Near:   Left Eye Near:    Bilateral Near:     Physical Exam    Constitutional: She is oriented to person, place, and time. She appears well-developed and well-nourished.  Non-toxic appearance. She does not appear ill. No distress.  HENT:  Head: Normocephalic.  Mouth/Throat: Oropharynx is clear and moist.  Eyes: Pupils are equal, round, and reactive to light.  Neck: Normal range of motion.  Pulmonary/Chest: Effort normal and breath sounds normal. No tachypnea. No respiratory distress.  Musculoskeletal: Normal range of motion.  Lymphadenopathy:    She has no cervical adenopathy.  Neurological: She is alert and oriented to person, place, and time.  Skin: Skin is warm and dry.  Psychiatric: She has a normal mood and affect. Her behavior is normal.  Nursing note and vitals reviewed.    UC Treatments / Results  Labs (all labs ordered are listed, but only abnormal results are displayed) Labs Reviewed - No data to display  EKG  EKG Interpretation None       Radiology Dg Chest 2 View  Result Date: 07/08/2017 CLINICAL DATA:  Cough and shortness of breath for several days. EXAM: CHEST  2 VIEW COMPARISON:  10/21/2016 and prior radiographs FINDINGS: The cardiomediastinal silhouette is unremarkable. Mild hyperinflation again noted. There is no evidence of focal airspace disease, pulmonary edema, suspicious pulmonary nodule/mass, pleural effusion, or pneumothorax. No acute bony abnormalities are identified. IMPRESSION: No active cardiopulmonary disease. Electronically Signed   By: Margarette Canada M.D.   On: 07/08/2017 11:47    Procedures Procedures (including critical care time)  Medications Ordered in UC Medications - No data to display   Initial Impression / Assessment and Plan / UC Course  I have reviewed the triage vital signs and the nursing notes.  Pertinent labs & imaging results that were available during my care of the patient were reviewed by me and considered in my medical decision making (see chart for details).     Plan: 1.  Test/x-ray results and diagnosis reviewed with patient 2. rx as per orders; risks, benefits, potential side effects reviewed with patient 3. Recommend supportive treatment with use of inhalers in order to alleviate shortness of breath.  Because of her asthma and her advanced age I will prescribe empirically antibiotics plus short course of a prednisone taper.  Continues to have problems I recommended that she follow-up with her primary care physician.  Go to the emergency room immediately. 4. F/u prn if symptoms worsen or don't improve   Final Clinical Impressions(s) / UC Diagnoses   Final diagnoses:  Acute upper respiratory infection  SOB (shortness of breath)    ED Discharge Orders        Ordered    amoxicillin-clavulanate (AUGMENTIN) 875-125 MG tablet  Every 12 hours     07/08/17 1202    methylPREDNISolone (MEDROL DOSEPAK) 4 MG TBPK tablet  Status:  Discontinued     07/08/17 1202    ondansetron (ZOFRAN-ODT) 4 MG disintegrating tablet  2 times daily     07/08/17 1202    methylPREDNISolone (MEDROL DOSEPAK) 4 MG TBPK tablet     07/08/17 1204       Controlled Substance Prescriptions [place] Controlled Substance Registry consulted? Not Applicable   Lorin Picket, PA-C 07/08/17 1742

## 2017-07-08 NOTE — ED Triage Notes (Signed)
Patient c/o sinus congestion and pressure for couple of days.  Patient reports SOB that started this morning.  Patient denies chest pain or SOB.  Patient reports feeling lightheaded this morning.  Patient reports history of asthma and has used her inhaler today.

## 2017-07-08 NOTE — Discharge Instructions (Signed)
If you worsen go immediately to the emergency room.  Be Sure to drink plenty of fluids.

## 2018-03-10 ENCOUNTER — Encounter: Payer: Self-pay | Admitting: Emergency Medicine

## 2018-03-10 ENCOUNTER — Emergency Department
Admission: EM | Admit: 2018-03-10 | Discharge: 2018-03-11 | Disposition: A | Discharge status: Home / Self Care | Payer: Medicare Other | Attending: Emergency Medicine | Admitting: Emergency Medicine

## 2018-03-10 ENCOUNTER — Emergency Department: Payer: Medicare Other

## 2018-03-10 DIAGNOSIS — R0789 Other chest pain: Secondary | ICD-10-CM

## 2018-03-10 DIAGNOSIS — Z7982 Long term (current) use of aspirin: Secondary | ICD-10-CM | POA: Diagnosis not present

## 2018-03-10 DIAGNOSIS — J45909 Unspecified asthma, uncomplicated: Secondary | ICD-10-CM | POA: Diagnosis not present

## 2018-03-10 DIAGNOSIS — Z79899 Other long term (current) drug therapy: Secondary | ICD-10-CM | POA: Insufficient documentation

## 2018-03-10 DIAGNOSIS — E119 Type 2 diabetes mellitus without complications: Secondary | ICD-10-CM | POA: Insufficient documentation

## 2018-03-10 DIAGNOSIS — R002 Palpitations: Secondary | ICD-10-CM | POA: Insufficient documentation

## 2018-03-10 DIAGNOSIS — I10 Essential (primary) hypertension: Secondary | ICD-10-CM | POA: Insufficient documentation

## 2018-03-10 LAB — BASIC METABOLIC PANEL
Anion gap: 6 (ref 5–15)
BUN: 20 mg/dL (ref 8–23)
CHLORIDE: 103 mmol/L (ref 98–111)
CO2: 27 mmol/L (ref 22–32)
Calcium: 9.3 mg/dL (ref 8.9–10.3)
Creatinine, Ser: 0.98 mg/dL (ref 0.44–1.00)
GFR calc non Af Amer: 51 mL/min — ABNORMAL LOW (ref >60)
GFR, EST AFRICAN AMERICAN: 59 mL/min — AB (ref >60)
Glucose, Bld: 146 mg/dL — ABNORMAL HIGH (ref 70–99)
Potassium: 4.4 mmol/L (ref 3.5–5.1)
SODIUM: 136 mmol/L (ref 135–145)

## 2018-03-10 LAB — CBC
HEMATOCRIT: 36.8 % (ref 35.0–47.0)
Hemoglobin: 12.5 g/dL (ref 12.0–16.0)
MCH: 32.3 pg (ref 26.0–34.0)
MCHC: 33.9 g/dL (ref 32.0–36.0)
MCV: 95.2 fL (ref 80.0–100.0)
Platelets: 238 10*3/uL (ref 150–440)
RBC: 3.86 MIL/uL (ref 3.80–5.20)
RDW: 13.4 % (ref 11.5–14.5)
WBC: 9.4 10*3/uL (ref 3.6–11.0)

## 2018-03-10 LAB — TROPONIN I: Troponin I: <0.03 ng/mL (ref <0.03)

## 2018-03-10 MED ORDER — MAGNESIUM SULFATE 4 GM/100ML IV SOLN
4.0000 g | Freq: Once | INTRAVENOUS | Status: AC
  Administered 2018-03-11: 4 g via INTRAVENOUS
  Filled 2018-03-10: qty 100

## 2018-03-10 MED ORDER — SODIUM CHLORIDE 0.9 % IV BOLUS
500.0000 mL | Freq: Once | INTRAVENOUS | Status: AC
  Administered 2018-03-10: 500 mL via INTRAVENOUS

## 2018-03-10 NOTE — ED Triage Notes (Signed)
Pt reports "skipping" heart beat and tightness to the left side of her chest that started around 7pm; some shortness of breath; denies nausea/vomting; denies diaphoresis; pt with history of PVC's diagnosed 1 1/2 years ago; pt talking in complete coherent sentences

## 2018-03-10 NOTE — ED Provider Notes (Signed)
[facility] Emergency Department Provider Note  ____________________________________________   First MD Initiated Contact with Patient 03/10/18 2322     (approximate)  I have reviewed the triage vital signs and the nursing notes.   HISTORY  Chief Complaint Chest Pain   HPI Jaime Berger is a 84 y.o. female who comes to the emergency department with intermittent palpitations and "tightness" to her left lateral chest that began around 7 PM roughly 2-1/2 hours prior to arrival.  Some mild shortness of breath.  No nausea or vomiting.  No diaphoresis.  The pain was not ripping or tearing does not go straight to her back.  No history of DVTs or pulmonary embolism.  No known history of coronary artery disease although she does have diabetes hypertension and dyslipidemia.  Her symptoms are short-lived.  Nonexertional.  Nothing seems to make them better or worse.  She has been told she has had "palpitations" in the past for which she has gotten no treatment.    Past Medical History:  Diagnosis Date  . Arthritis    legs, hands, "everywhere"  . Asthma   . Carotid artery occlusion   . Complication of anesthesia    Pt reports during Colonoscopy at [facility] was "overdosed" on meds and died and was brought back  . Family history of adverse reaction to anesthesia    nausea  . GERD (gastroesophageal reflux disease)   . Heart murmur    "Yrs ago", no mention recently  . Hyperlipidemia   . Hypertension   . Liver enzyme elevation   . Osteoporosis    back  . Prosthetic eye globe    right  . Vertigo     Patient Active Problem List   Diagnosis Date Noted  . Chest pain, rule out acute myocardial infarction 10/21/2016  . Compression fracture of vertebral column ([place]) 12/02/2015  . Pancreatitis 11/05/2015  . Awareness of heartbeats 09/19/2015  . AG (atrophic gastritis) 03/03/2015  . Blind one eye 01/01/2015  . Drug intolerance 12/03/2014  . HLD (hyperlipidemia)  03/06/2012  . Pseudoaphakia 02/17/2012  . Airway hyperreactivity 01/03/2011  . Controlled diabetes mellitus type II without complication ([place]) 50/53/9767  . Acid reflux 01/03/2011  . Benign essential HTN 01/03/2011  . 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 OP (osteoporosis) 01/03/2011  . Syncope and collapse 01/03/2011  . Malignant melanoma of skin ([place]) 01/03/2011  . Controlled type 2 diabetes mellitus without complication ([place]) 34/19/3790    Past Surgical History:  Procedure Laterality Date  . ABDOMINAL HYSTERECTOMY    . APPENDECTOMY    . BREAST SURGERY Right    lumpectomy  . CHOLECYSTECTOMY    . ESOPHAGOGASTRODUODENOSCOPY N/A 12/04/2014   Procedure: ESOPHAGOGASTRODUODENOSCOPY (EGD);  Surgeon: Lucilla Lame, MD;  Location: [place];  Service: Gastroenterology;  Laterality: N/A;  REFERRING DR Hortencia Pilar  . EYE SURGERY Right 1975    prosthesis after melanoma  . HAND / FINGER LESION EXCISION     "nodules"    Prior to Admission medications   Medication Sig Start Date End Date Taking? Authorizing Provider  albuterol (PROVENTIL HFA;VENTOLIN HFA) 108 (90 BASE) MCG/ACT inhaler Inhale into the lungs every 6 (six) hours as needed for wheezing or shortness of breath.   Yes [provider]  aspirin 325 MG tablet Take 325 mg by mouth every 6 (six) hours as needed.   Yes [provider]  budesonide-formoterol (SYMBICORT) 160-4.5 MCG/ACT inhaler Inhale 2 puffs into the lungs 2 (two) times daily.  Usually only uses in AM   Yes [provider]  calcium carbonate (TUMS EX) 750 MG chewable tablet Chew 1 tablet by mouth 3 (three) times daily.   Yes [provider]  carvedilol (COREG) 12.5 MG tablet Take 1 tablet (12.5 mg total) by mouth 2 (two) times daily with a meal. 11/08/15  Yes Gouru, Aruna, MD  diltiazem (DILACOR XR) 120 MG 24 hr capsule Take 120 mg by mouth daily.   Yes [provider]  losartan (COZAAR) 100 MG tablet Take 100 mg  by mouth daily.    Yes [provider]    Allergies Lipitor [atorvastatin]; Lovastatin; Diphenhydramine hcl; Doxycycline; Nsaids; Strawberry (diagnostic); Sucralfate; Sulfa antibiotics; and Metformin and related  History reviewed. No pertinent family history.  Social History Social History   Tobacco Use  . Smoking status: Never Smoker  . Smokeless tobacco: Never Used  Substance Use Topics  . Alcohol use: No  . Drug use: No    Review of Systems Constitutional: No fever/chills Eyes: No visual changes. ENT: No sore throat. Cardiovascular: Positive for chest pain. Respiratory: Positive for shortness of breath. Gastrointestinal: No abdominal pain.  No nausea, no vomiting.  No diarrhea.  No constipation. Genitourinary: Negative for dysuria. Musculoskeletal: Negative for back pain. Skin: Negative for rash. Neurological: Negative for headaches, focal weakness or numbness.   ____________________________________________   PHYSICAL EXAM:  VITAL SIGNS: ED Triage Vitals  Enc Vitals Group     BP 03/10/18 2138 (!) 172/70     Pulse Rate 03/10/18 2138 78     Resp 03/10/18 2138 17     Temp 03/10/18 2138 99 F (37.2 C)     Temp Source 03/10/18 2138 Oral     SpO2 03/10/18 2138 95 %     Weight 03/10/18 2139 148 lb (67.1 kg)     Height 03/10/18 2139 5' (1.524 m)     Head Circumference --      Peak Flow --      Pain Score 03/10/18 2139 1     Pain Loc --      Pain Edu? --      Excl. in [place]? --     Constitutional: Alert and oriented x4 pleasant cooperative speaks in full clear sentences no diaphoresis Eyes: PERRL EOMI. Head: Atraumatic. Nose: No congestion/rhinnorhea. Mouth/Throat: No trismus Neck: No stridor.   Cardiovascular: Irregularly irregular although regular rate no murmurs appreciated Respiratory: Normal respiratory effort.  No retractions. Lungs CTAB and moving good air Gastrointestinal: Soft nontender Musculoskeletal: No lower extremity edema   Neurologic:   Normal speech and language. No gross focal neurologic deficits are appreciated. Skin:  Skin is warm, dry and intact. No rash noted. Psychiatric: Mildly anxious appearing  ____________________________________________   DIFFERENTIAL includes but not limited to  Acute coronary syndrome, ventricular tachycardia, atrial fibrillation, dehydration ____________________________________________   LABS (all labs ordered are listed, but only abnormal results are displayed)  Labs Reviewed  BASIC METABOLIC PANEL - Abnormal; Notable for the following components:      Result Value   Glucose, Bld 146 (*)    GFR calc non Af Amer 51 (*)    GFR calc Af Amer 59 (*)    All other components within normal limits  CBC  TROPONIN I  TROPONIN I    Lab work reviewed by me with no signs of acute ischemia x2 __________________________________________  EKG  ED ECG REPORT I, Darel Hong, the attending physician, personally viewed and interpreted this ECG.  Date: 03/10/2018  EKG Time:  Rate: 61 Rhythm: normal sinus rhythm with atrial premature contractions QRS Axis: normal Intervals: normal ST/T Wave abnormalities: normal Narrative Interpretation: no evidence of acute ischemia  ____________________________________________  RADIOLOGY  Chest x-ray reviewed by me with chronic changes but no acute disease ____________________________________________   PROCEDURES  Procedure(s) performed: no  Procedures  Critical Care performed: no  ____________________________________________   INITIAL IMPRESSION / ASSESSMENT AND PLAN / ED COURSE  Pertinent labs & imaging results that were available during my care of the patient were reviewed by me and considered in my medical decision making (see chart for details).   As part of my medical decision making, I reviewed the following data within the [facility] History obtained from family if available, nursing notes, old chart and ekg, as  well as notes from prior ED visits.  The patient arrives well-appearing with atypical chest pain.  She is having multiple frequent PACs and occasional PVCs on monitor.  She does have significant risk factors for coronary artery disease I think she requires at least 2 troponins.  We will give her IV magnesium in the meantime.  The patient second troponin is negative.  She will be discharged home with cardiology follow-up.      ____________________________________________   FINAL CLINICAL IMPRESSION(S) / ED DIAGNOSES  Final diagnoses:  Palpitations  Atypical chest pain      NEW MEDICATIONS STARTED DURING THIS VISIT:  Discharge Medication List as of 03/11/2018  2:17 AM       Note:  This document was prepared using Dragon voice recognition software and may include unintentional dictation errors.     Darel Hong, MD 03/12/18 [phone]

## 2018-03-11 ENCOUNTER — Other Ambulatory Visit: Payer: Self-pay

## 2018-03-11 DIAGNOSIS — R0789 Other chest pain: Secondary | ICD-10-CM | POA: Diagnosis not present

## 2018-03-11 LAB — TROPONIN I: Troponin I: <0.03 ng/mL (ref <0.03)

## 2018-03-11 NOTE — Discharge Instructions (Signed)
Fortunately today your lab work was reassuring.  Please begin taking a multivitamin every day and remain well-hydrated.  Follow-up with your cardiologist within a week for reevaluation and return to the emergency department sooner for any concerns.  It was a pleasure to take care of you today, and thank you for coming to our emergency department.  If you have any questions or concerns before leaving please ask the nurse to grab me and I'm more than happy to go through your aftercare instructions again.  If you were prescribed any opioid pain medication today such as Norco, Vicodin, Percocet, morphine, hydrocodone, or oxycodone please make sure you do not drive when you are taking this medication as it can alter your ability to drive safely.  If you have any concerns once you are home that you are not improving or are in fact getting worse before you can make it to your follow-up appointment, please do not hesitate to call 911 and come back for further evaluation.  Darel Hong, MD  Results for orders placed or performed during the hospital encounter of 14/78/29  Basic metabolic panel  Result Value Ref Range   Sodium 136 135 - 145 mmol/L   Potassium 4.4 3.5 - 5.1 mmol/L   Chloride 103 98 - 111 mmol/L   CO2 27 22 - 32 mmol/L   Glucose, Bld 146 (H) 70 - 99 mg/dL   BUN 20 8 - 23 mg/dL   Creatinine, Ser 0.98 0.44 - 1.00 mg/dL   Calcium 9.3 8.9 - 10.3 mg/dL   GFR calc non Af Amer 51 (L) >60 mL/min   GFR calc Af Amer 59 (L) >60 mL/min   Anion gap 6 5 - 15  CBC  Result Value Ref Range   WBC 9.4 3.6 - 11.0 K/uL   RBC 3.86 3.80 - 5.20 MIL/uL   Hemoglobin 12.5 12.0 - 16.0 g/dL   HCT 36.8 35.0 - 47.0 %   MCV 95.2 80.0 - 100.0 fL   MCH 32.3 26.0 - 34.0 pg   MCHC 33.9 32.0 - 36.0 g/dL   RDW 13.4 11.5 - 14.5 %   Platelets 238 150 - 440 K/uL  Troponin I  Result Value Ref Range   Troponin I <0.03 <0.03 ng/mL  Troponin I  Result Value Ref Range   Troponin I <0.03 <0.03 ng/mL   Dg Chest 2  View  Result Date: 03/10/2018 CLINICAL DATA:  Skipping heart beat and tightness to the left chest starting around 7 p.m. Shortness of breath. Prior history of PVC's. EXAM: CHEST - 2 VIEW COMPARISON:  07/08/2017 FINDINGS: Mild hyperinflation with scattered fibrosis in the lungs suggesting emphysematous change. Heart size and pulmonary vascularity are normal. No airspace disease or consolidation in the lungs. No blunting of costophrenic angles. No pneumothorax. Mediastinal contours appear intact. Degenerative changes in the spine. Mild chronic compression of the superior endplate of F62. Aortic calcifications. IMPRESSION: Emphysematous changes in the lungs. No evidence of active pulmonary disease. Electronically Signed   By: Lucienne Capers M.D.   On: 03/10/2018 22:45

## 2018-04-20 IMAGING — CR DG ABDOMEN 2V
2 series · 2 of 2 positions shown · non-contrast
Comparison: CT abdomen and pelvis December 02, 2015

CLINICAL DATA: Vomiting and diarrhea for 3 days

EXAM:
ABDOMEN - 2 VIEW

[abdomen erect]
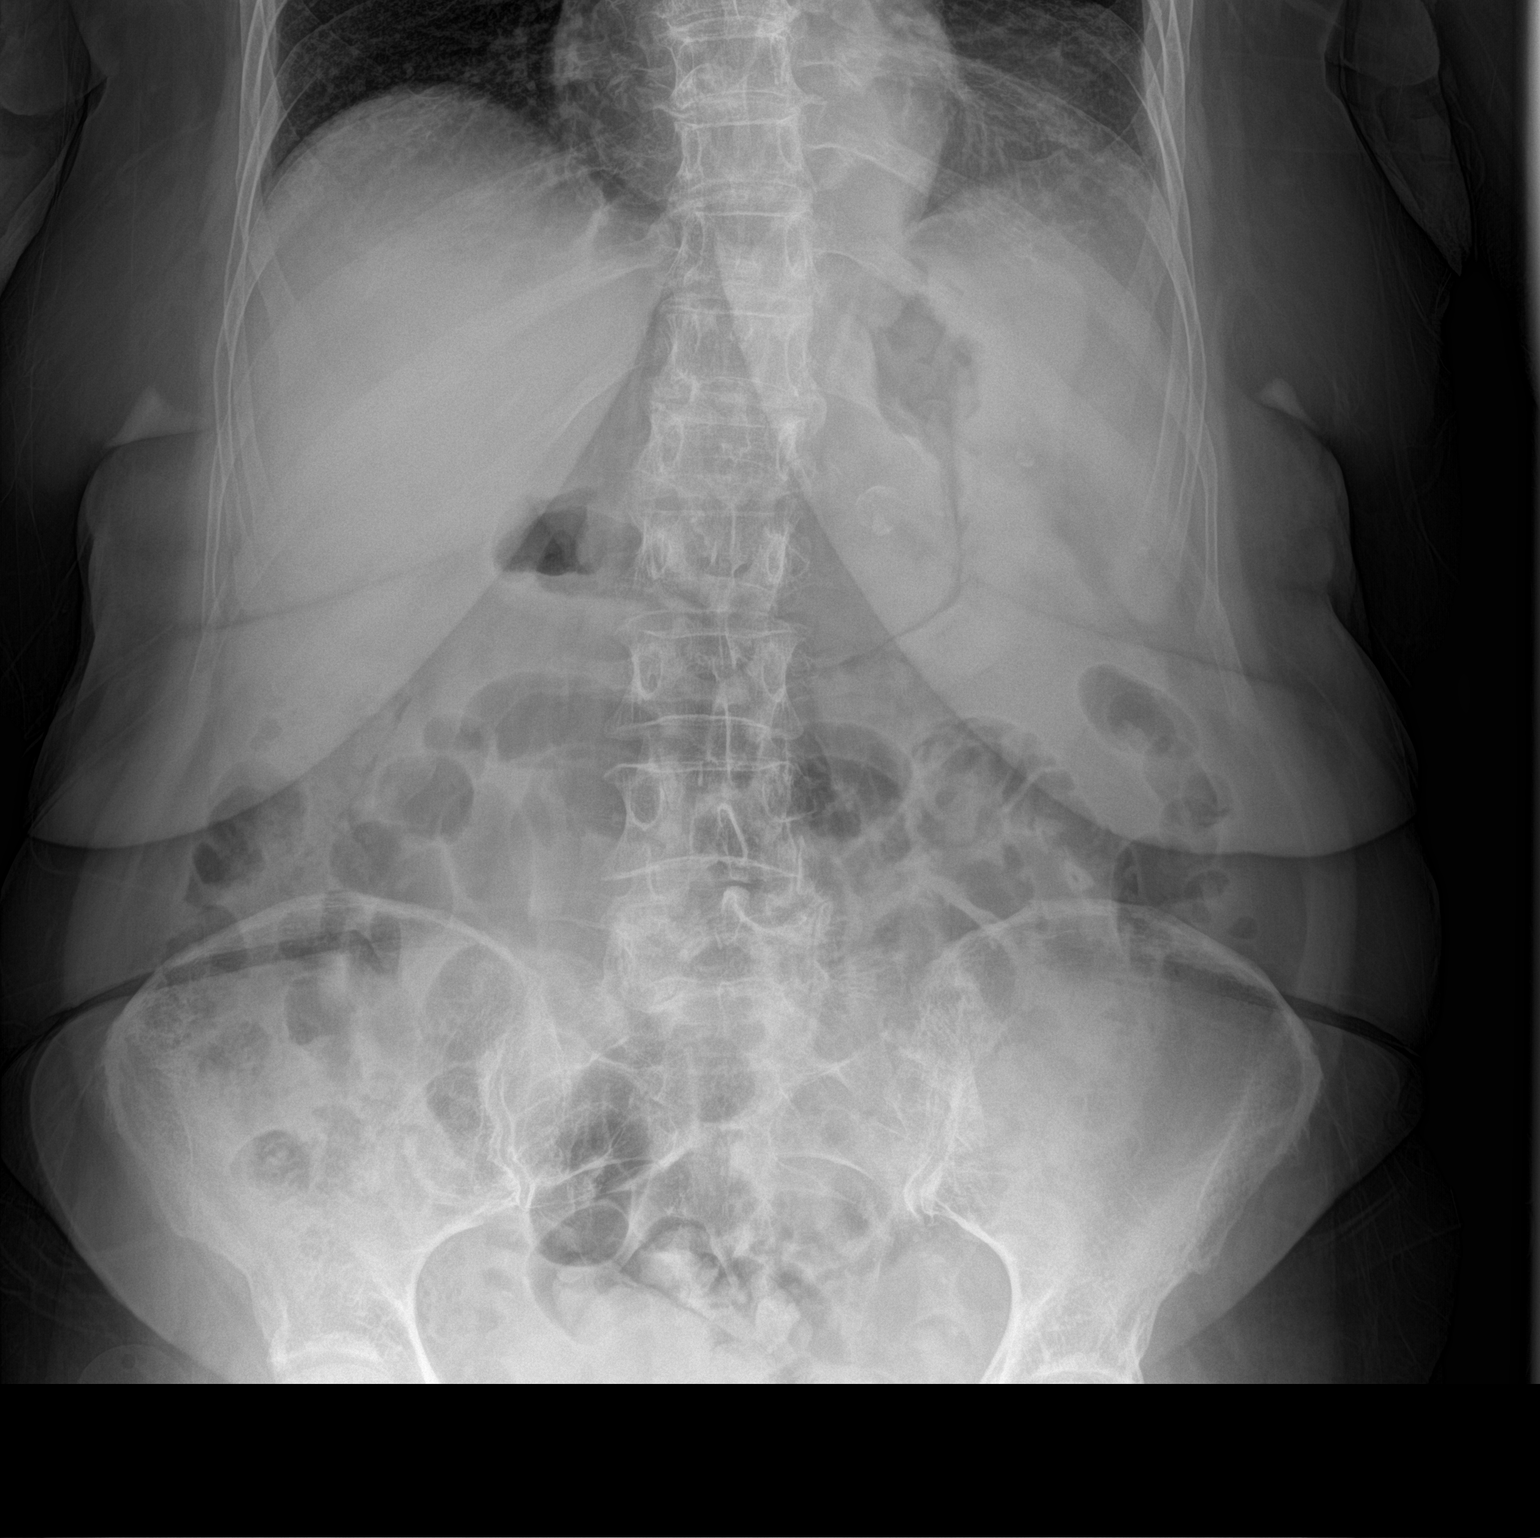

[abdomen supine]
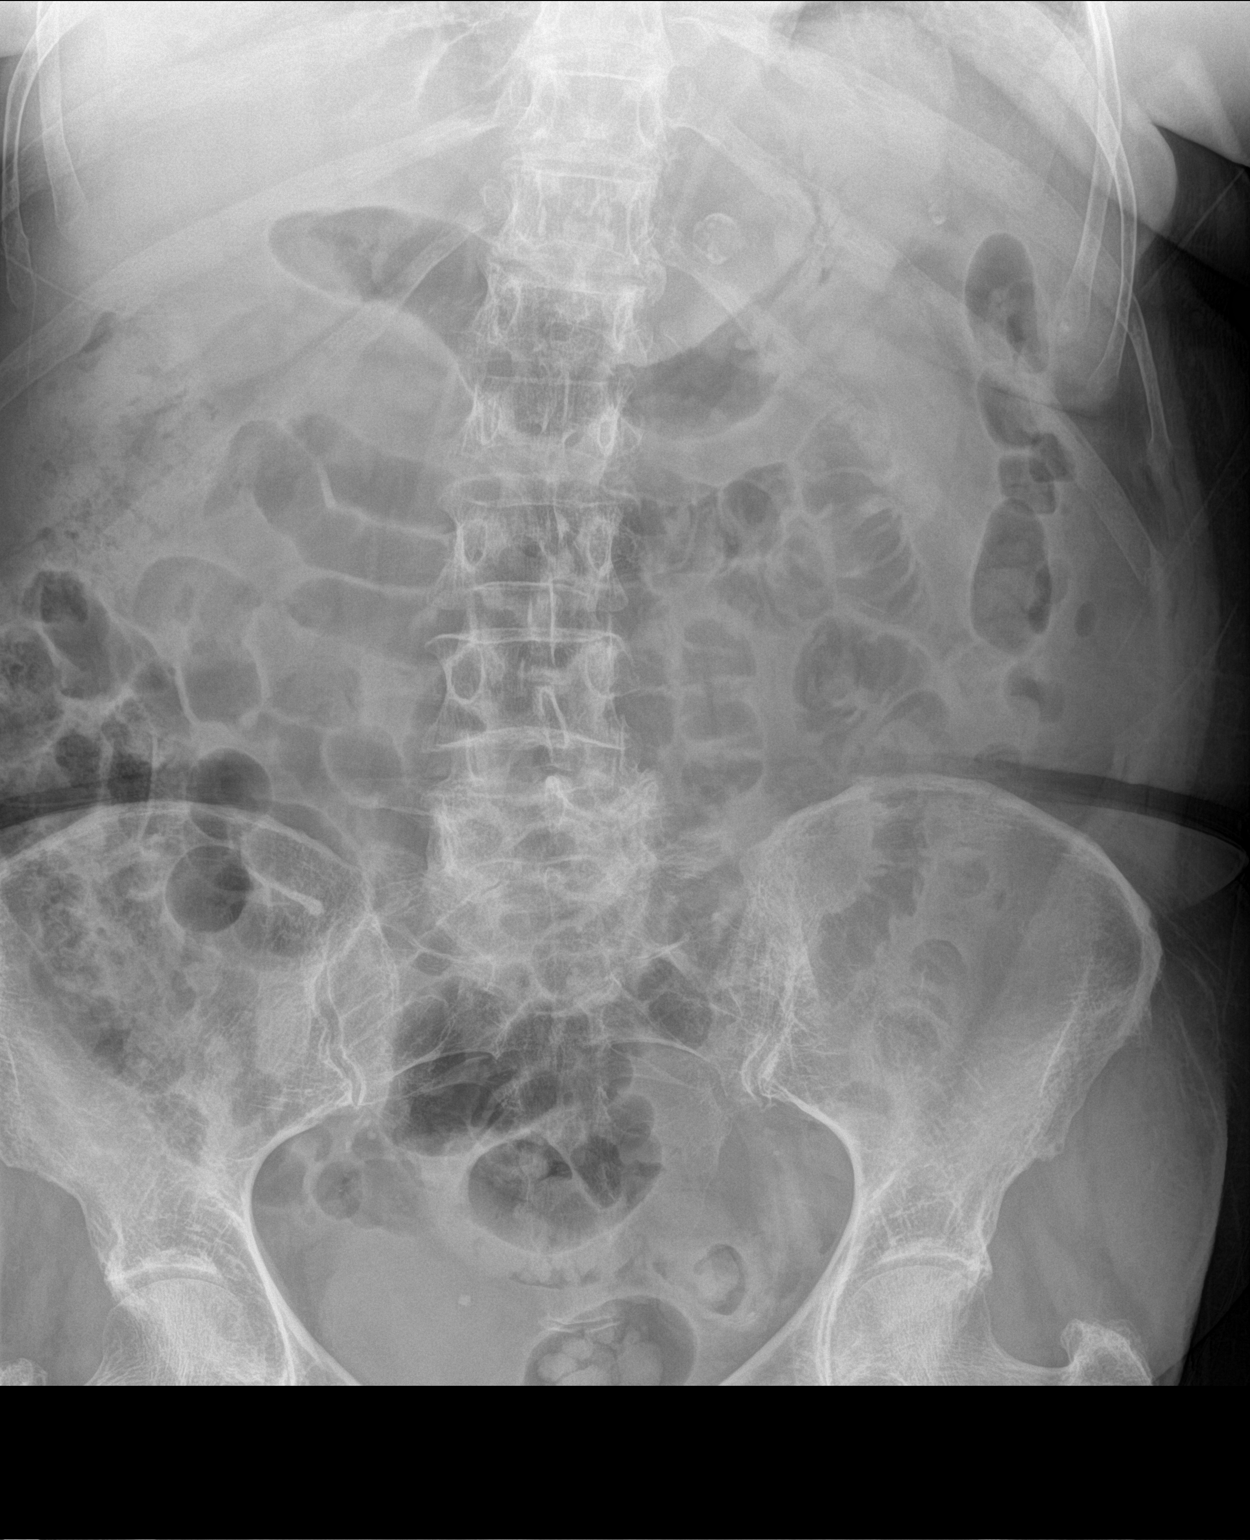

[2 of 2 positions shown; findings below may reference images not displayed]

FINDINGS: Supine and upright images were obtained. There is moderate stool
throughout the colon. There is no bowel dilatation or air-fluid
level suggesting obstruction. No free air. A calcification in the
left upper abdomen likely represents a splenic artery aneurysm,
measuring 1.2 x 1.2 cm. Lung bases are clear. There are phleboliths
in the pelvis.
IMPRESSION: Moderate stool in colon. No bowel obstruction or free air. Small
calcified splenic artery aneurysm, a finding not felt to have
clinical significance in this age group. Lung bases clear.

## 2018-05-30 ENCOUNTER — Ambulatory Visit: Payer: Medicare Other | Admitting: Gastroenterology

## 2018-10-26 ENCOUNTER — Encounter: Payer: Self-pay | Admitting: Emergency Medicine

## 2018-10-26 ENCOUNTER — Emergency Department: Payer: Medicare Other

## 2018-10-26 ENCOUNTER — Other Ambulatory Visit: Payer: Self-pay

## 2018-10-26 ENCOUNTER — Emergency Department
Admission: EM | Admit: 2018-10-26 | Discharge: 2018-10-26 | Disposition: A | Discharge status: Home / Self Care | Payer: Medicare Other | Attending: Emergency Medicine | Admitting: Emergency Medicine

## 2018-10-26 DIAGNOSIS — I10 Essential (primary) hypertension: Secondary | ICD-10-CM | POA: Diagnosis not present

## 2018-10-26 DIAGNOSIS — Z79899 Other long term (current) drug therapy: Secondary | ICD-10-CM | POA: Diagnosis not present

## 2018-10-26 DIAGNOSIS — R197 Diarrhea, unspecified: Secondary | ICD-10-CM | POA: Insufficient documentation

## 2018-10-26 DIAGNOSIS — E785 Hyperlipidemia, unspecified: Secondary | ICD-10-CM | POA: Diagnosis not present

## 2018-10-26 DIAGNOSIS — R079 Chest pain, unspecified: Secondary | ICD-10-CM | POA: Insufficient documentation

## 2018-10-26 DIAGNOSIS — R109 Unspecified abdominal pain: Secondary | ICD-10-CM | POA: Diagnosis not present

## 2018-10-26 LAB — URINALYSIS, COMPLETE (UACMP) WITH MICROSCOPIC
Bacteria, UA: NONE SEEN
Bilirubin Urine: NEGATIVE
Glucose, UA: NEGATIVE mg/dL
Hgb urine dipstick: NEGATIVE
Ketones, ur: NEGATIVE mg/dL
Leukocytes,Ua: NEGATIVE
Nitrite: NEGATIVE
Protein, ur: NEGATIVE mg/dL
Specific Gravity, Urine: 1.014 (ref 1.005–1.030)
pH: 6 (ref 5.0–8.0)

## 2018-10-26 LAB — CBC WITH DIFFERENTIAL/PLATELET
Abs Immature Granulocytes: 0.02 10*3/uL (ref 0.00–0.07)
Basophils Absolute: 0.1 10*3/uL (ref 0.0–0.1)
Basophils Relative: 1 %
Eosinophils Absolute: 0.2 10*3/uL (ref 0.0–0.5)
Eosinophils Relative: 3 %
HCT: 40 % (ref 36.0–46.0)
Hemoglobin: 13.5 g/dL (ref 12.0–15.0)
Immature Granulocytes: 0 %
Lymphocytes Relative: 36 %
Lymphs Abs: 2.6 10*3/uL (ref 0.7–4.0)
MCH: 31.5 pg (ref 26.0–34.0)
MCHC: 33.8 g/dL (ref 30.0–36.0)
MCV: 93.5 fL (ref 80.0–100.0)
Monocytes Absolute: 0.8 10*3/uL (ref 0.1–1.0)
Monocytes Relative: 11 %
Neutro Abs: 3.6 10*3/uL (ref 1.7–7.7)
Neutrophils Relative %: 49 %
Platelets: 195 10*3/uL (ref 150–400)
RBC: 4.28 MIL/uL (ref 3.87–5.11)
RDW: 13.6 % (ref 11.5–15.5)
WBC: 7.3 10*3/uL (ref 4.0–10.5)
nRBC: 0 % (ref 0.0–0.2)

## 2018-10-26 LAB — COMPREHENSIVE METABOLIC PANEL
ALT: 19 U/L (ref 0–44)
AST: 26 U/L (ref 15–41)
Albumin: 3.8 g/dL (ref 3.5–5.0)
Alkaline Phosphatase: 50 U/L (ref 38–126)
Anion gap: 7 (ref 5–15)
BUN: 21 mg/dL (ref 8–23)
CO2: 26 mmol/L (ref 22–32)
Calcium: 9.2 mg/dL (ref 8.9–10.3)
Chloride: 102 mmol/L (ref 98–111)
Creatinine, Ser: 1.02 mg/dL — ABNORMAL HIGH (ref 0.44–1.00)
GFR calc Af Amer: 57 mL/min — ABNORMAL LOW (ref >60)
GFR calc non Af Amer: 49 mL/min — ABNORMAL LOW (ref >60)
Glucose, Bld: 123 mg/dL — ABNORMAL HIGH (ref 70–99)
Potassium: 4 mmol/L (ref 3.5–5.1)
Sodium: 135 mmol/L (ref 135–145)
Total Bilirubin: 1 mg/dL (ref 0.3–1.2)
Total Protein: 7.1 g/dL (ref 6.5–8.1)

## 2018-10-26 LAB — LIPASE, BLOOD: Lipase: 39 U/L (ref 11–51)

## 2018-10-26 LAB — TROPONIN I
Troponin I: <0.03 ng/mL (ref <0.03)
Troponin I: <0.03 ng/mL (ref <0.03)

## 2018-10-26 MED ORDER — SODIUM CHLORIDE 0.9 % IV BOLUS
1000.0000 mL | Freq: Once | INTRAVENOUS | Status: AC
  Administered 2018-10-26: 1000 mL via INTRAVENOUS

## 2018-10-26 NOTE — Discharge Instructions (Addendum)

## 2018-10-26 NOTE — ED Notes (Signed)
Patient assisted to use toilet. Ambulatory independently with steady gait. Patient repositioned in bed and hooked up to cardiac monitor.

## 2018-10-26 NOTE — ED Triage Notes (Signed)
Pt presents for chest pain that is intermittent and radiates to left arm. Unlabored. No SHOB, cough or fever. Pt has also had for 1 week abdominal pain and diarrhea; hx partial colectomy r/t CA. 1 episode vomiting this AM. Also has had vertigo per pt/dizzy feeling that was worse in the earlier week and has been getting better.

## 2018-10-26 NOTE — ED Notes (Signed)
Pt remains in room until daughter arrives.

## 2018-10-26 NOTE — ED Provider Notes (Signed)
[facility] Emergency Department Provider Note  ____________________________________________  Time seen: Approximately 1:08 PM  I have reviewed the triage vital signs and the nursing notes.   HISTORY  Chief Complaint Chest Pain; Abdominal Pain; and Diarrhea   HPI Jaime Berger is a 85 y.o. female with a history of hypertension, hyperlipidemia, asthma, diabetes, partial colectomy for colon cancer who presents for evaluation of chest pain.  Patient reports 4 episodes of chest pain today.  She reports initially she had 2 mild ones but then 2 more severe ones which prompted 911 to be called.  She reports that the pain felt like squeezing and pressure located in the center of her chest, associated with nausea and shortness of breath.  She is not sure how long the pain lasted but at this time she is pain-free.  She reports that he felt like reflux pain although much more severe.  She does have a history of such.  She denies abdominal pain, vomiting, fever, chills, cough.  Patient also complaining of worsening diarrhea.  She reports that since having her colectomy she usually has diarrhea however today she had 3 episodes.  No recent antibiotic use or history of C. Difficile.  Past Medical History:  Diagnosis Date  . Arthritis    legs, hands, "everywhere"  . Asthma   . Carotid artery occlusion   . Complication of anesthesia    Pt reports during Colonoscopy at [facility] was "overdosed" on meds and died and was brought back  . Family history of adverse reaction to anesthesia    nausea  . GERD (gastroesophageal reflux disease)   . Heart murmur    "Yrs ago", no mention recently  . Hyperlipidemia   . Hypertension   . Liver enzyme elevation   . Osteoporosis    back  . Prosthetic eye globe    right  . Vertigo     Patient Active Problem List   Diagnosis Date Noted  . Chest pain, rule out acute myocardial infarction 10/21/2016  . Compression fracture of vertebral  column ([place]) 12/02/2015  . Pancreatitis 11/05/2015  . Awareness of heartbeats 09/19/2015  . AG (atrophic gastritis) 03/03/2015  . Blind one eye 01/01/2015  . Drug intolerance 12/03/2014  . HLD (hyperlipidemia) 03/06/2012  . Pseudoaphakia 02/17/2012  . Airway hyperreactivity 01/03/2011  . Controlled diabetes mellitus type II without complication ([place]) 97/35/3299  . Acid reflux 01/03/2011  . Benign essential HTN 01/03/2011  . 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 OP (osteoporosis) 01/03/2011  . Syncope and collapse 01/03/2011  . Malignant melanoma of skin ([employer]) 01/03/2011  . Controlled type 2 diabetes mellitus without complication ([place]) 24/26/8341    Past Surgical History:  Procedure Laterality Date  . ABDOMINAL HYSTERECTOMY    . APPENDECTOMY    . BREAST SURGERY Right    lumpectomy  . CHOLECYSTECTOMY    . ESOPHAGOGASTRODUODENOSCOPY N/A 12/04/2014   Procedure: ESOPHAGOGASTRODUODENOSCOPY (EGD);  Surgeon: Lucilla Lame, MD;  Location: [place];  Service: Gastroenterology;  Laterality: N/A;  REFERRING DR Hortencia Pilar  . EYE SURGERY Right 1975    prosthesis after melanoma  . HAND / FINGER LESION EXCISION     "nodules"    Prior to Admission medications   Medication Sig Start Date End Date Taking? Authorizing Provider  albuterol (PROVENTIL HFA;VENTOLIN HFA) 108 (90 BASE) MCG/ACT inhaler Inhale into the lungs every 6 (six) hours as needed for wheezing or shortness of breath.    [provider]  aspirin 325  MG tablet Take 325 mg by mouth every 6 (six) hours as needed.    [provider]  budesonide-formoterol (SYMBICORT) 160-4.5 MCG/ACT inhaler Inhale 2 puffs into the lungs 2 (two) times daily. Usually only uses in AM    [provider]  calcium carbonate (TUMS EX) 750 MG chewable tablet Chew 1 tablet by mouth 3 (three) times daily.    [provider]  carvedilol (COREG) 12.5 MG tablet Take 1 tablet (12.5 mg total) by mouth 2 (two)  times daily with a meal. 11/08/15   Gouru, Aruna, MD  diltiazem (DILACOR XR) 120 MG 24 hr capsule Take 120 mg by mouth daily.    [provider]  losartan (COZAAR) 100 MG tablet Take 100 mg by mouth daily.     [provider]    Allergies Lipitor [atorvastatin]; Lovastatin; Diphenhydramine hcl; Doxycycline; Nsaids; Strawberry (diagnostic); Sucralfate; Sulfa antibiotics; and Metformin and related  FH Cancer Brother    Lung cancer Brother    Asthma Daughter Sydell Axon   Breast cancer Daughter Joy   Osteoarthritis Daughter Joy   Hyperlipidemia (Elevated cholesterol) Daughter Vaughan Basta   Migraines Daughter Vaughan Basta   Skin cancer Daughter Vaughan Basta   Coronary Artery Disease (Blocked arteries around heart) Father    Coronary Artery Disease (Blocked arteries around heart) Mother    Macular degeneration Mother    Coronary Artery Disease (Blocked arteries around heart) Sister    Diabetes type II Sister    Stroke Sister    Aneurysm Sister    Cancer Sister    No Known Problems Son      Social History Social History   Tobacco Use  . Smoking status: Never Smoker  . Smokeless tobacco: Never Used  Substance Use Topics  . Alcohol use: No  . Drug use: No    Review of Systems  Constitutional: Negative for fever. Eyes: Negative for visual changes. ENT: Negative for sore throat. Neck: No neck pain  Cardiovascular: + chest pain. Respiratory: + shortness of breath. Gastrointestinal: Negative for abdominal pain, vomiting. + diarrhea. Genitourinary: Negative for dysuria. Musculoskeletal: Negative for back pain. Skin: Negative for rash. Neurological: Negative for headaches, weakness or numbness. Psych: No SI or HI  ____________________________________________   PHYSICAL EXAM:  VITAL SIGNS: ED Triage Vitals  Enc Vitals Group     BP 10/26/18 1155 (!) 184/56     Pulse Rate 10/26/18 1155 64     Resp 10/26/18 1155 14     Temp 10/26/18 1155 98.8 F (37.1  C)     Temp Source 10/26/18 1155 Oral     SpO2 10/26/18 1155 97 %     Weight 10/26/18 1152 130 lb (59 kg)     Height 10/26/18 1152 5\' 1"  (1.549 m)     Head Circumference --      Peak Flow --      Pain Score 10/26/18 1149 0     Pain Loc --      Pain Edu? --      Excl. in [place]? --     Constitutional: Alert and oriented. Well appearing and in no apparent distress. HEENT:      Head: Normocephalic and atraumatic.         Eyes: Conjunctivae are normal. Sclera is non-icteric.       Mouth/Throat: Mucous membranes are moist.       Neck: Supple with no signs of meningismus. Cardiovascular: Regular rate and rhythm. No murmurs, gallops, or rubs. 2+ symmetrical distal pulses are present  in all extremities. No JVD. Respiratory: Normal respiratory effort. Lungs are clear to auscultation bilaterally. No wheezes, crackles, or rhonchi.  Gastrointestinal: Soft, non tender, and non distended with positive bowel sounds. No rebound or guarding. Musculoskeletal: Nontender with normal range of motion in all extremities. No edema, cyanosis, or erythema of extremities. Neurologic: Normal speech and language. Face is symmetric. Moving all extremities. No gross focal neurologic deficits are appreciated. Skin: Skin is warm, dry and intact. No rash noted. Psychiatric: Mood and affect are normal. Speech and behavior are normal.  ____________________________________________   LABS (all labs ordered are listed, but only abnormal results are displayed)  Labs Reviewed  COMPREHENSIVE METABOLIC PANEL - Abnormal; Notable for the following components:      Result Value   Glucose, Bld 123 (*)    Creatinine, Ser 1.02 (*)    GFR calc non Af Amer 49 (*)    GFR calc Af Amer 57 (*)    All other components within normal limits  URINALYSIS, COMPLETE (UACMP) WITH MICROSCOPIC - Abnormal; Notable for the following components:   Color, Urine YELLOW (*)    APPearance HAZY (*)    All other components within normal limits  C  DIFFICILE QUICK SCREEN W PCR REFLEX  TROPONIN I  CBC WITH DIFFERENTIAL/PLATELET  LIPASE, BLOOD  TROPONIN I   ____________________________________________  EKG  ED ECG REPORT I, Rudene Re, the attending physician, personally viewed and interpreted this ECG.  Normal sinus rhythm, rate of 64, normal intervals, normal axis, no ST elevations or depressions.  Unchanged from prior ____________________________________________  RADIOLOGY  I have personally reviewed the images performed during this visit and I agree with the Radiologist's read.   Interpretation by Radiologist:  Dg Chest 2 View  Result Date: 10/26/2018 CLINICAL DATA:  Chest pain radiating to left arm EXAM: CHEST - 2 VIEW COMPARISON:  03/10/2018 FINDINGS: There is bilateral chronic interstitial thickening. There is no focal parenchymal opacity. There is no pleural effusion or pneumothorax. The heart and mediastinal contours are unremarkable. The osseous structures are unremarkable. IMPRESSION: No active cardiopulmonary disease. Electronically Signed   By: Kathreen Devoid   On: 10/26/2018 12:44   Dg Abdomen 1 View  Result Date: 10/26/2018 CLINICAL DATA:  No current complaints. EXAM: ABDOMEN - 1 VIEW COMPARISON:  Plain films the abdomen 01/20/2016. FINDINGS: The bowel gas pattern is normal. No radio-opaque calculi or other significant radiographic abnormality are seen. Small calcified splenic artery aneurysm noted. IMPRESSION: No acute abnormality. Electronically Signed   By: Inge Rise M.D.   On: 10/26/2018 13:17     ____________________________________________   PROCEDURES  Procedure(s) performed: None Procedures Critical Care performed:  None ____________________________________________   INITIAL IMPRESSION / ASSESSMENT AND PLAN / ED COURSE  85 y.o. female with a history of hypertension, hyperlipidemia, asthma, diabetes, partial colectomy for colon cancer who presents for evaluation of chest pain.   Patient with 4 episodes of chest pain associated with shortness of breath.  At this time she is pain-free.  She has no history of coronary artery disease.  She has family history of such.  Her initial EKG shows no evidence of ischemia.  She reports the pain was similar to GERD.  Will monitor on telemetry for any recurrence of the chest pain and get troponin x2 to rule out ischemia.  Abdomen is soft with no tenderness.  She is complaining of worsening diarrhea however has chronic diarrhea since her colectomy last year.  Will check for C. difficile.  Will check labs  to rule out dehydration or electrolyte abnormalities.    _________________________ 3:41 PM on 10/26/2018 -----------------------------------------  No further episodes of diarrhea in the emergency room.  No further episodes of chest pain.  Troponin x2 is negative.  Labs otherwise within normal limits with no signs of dehydration, AKI, electrolyte abnormalities.  No evidence of UTI.  Will discharge home and follow-up with primary care doctor.  Discussed my standard return precautions.   As part of my medical decision making, I reviewed the following data within the [facility] notes reviewed and incorporated, Labs reviewed , EKG interpreted , Old EKG reviewed, Old chart reviewed, Radiograph reviewed , Notes from prior ED visits and [place] Controlled Substance Database    Pertinent labs & imaging results that were available during my care of the patient were reviewed by me and considered in my medical decision making (see chart for details).    ____________________________________________   FINAL CLINICAL IMPRESSION(S) / ED DIAGNOSES  Final diagnoses:  Chest pain, unspecified type  Diarrhea, unspecified type      NEW MEDICATIONS STARTED DURING THIS VISIT:  ED Discharge Orders    None       Note:  This document was prepared using Dragon voice recognition software and may include unintentional dictation  errors.    Alfred Levins, [place], MD 10/26/18 [phone]

## 2019-01-20 IMAGING — CR DG CHEST 2V
1 series · 2 of 2 positions shown · non-contrast
Comparison: PA and lateral chest 03/25/2016 and 06/04/2013.

CLINICAL DATA: Intermittent chest heaviness for 6 days, worse
today.

EXAM:
CHEST  2 VIEW

[Series 1: dg chest 2 view · 0.14mm/px · 2 of 2 slices shown]
[im 1/2]
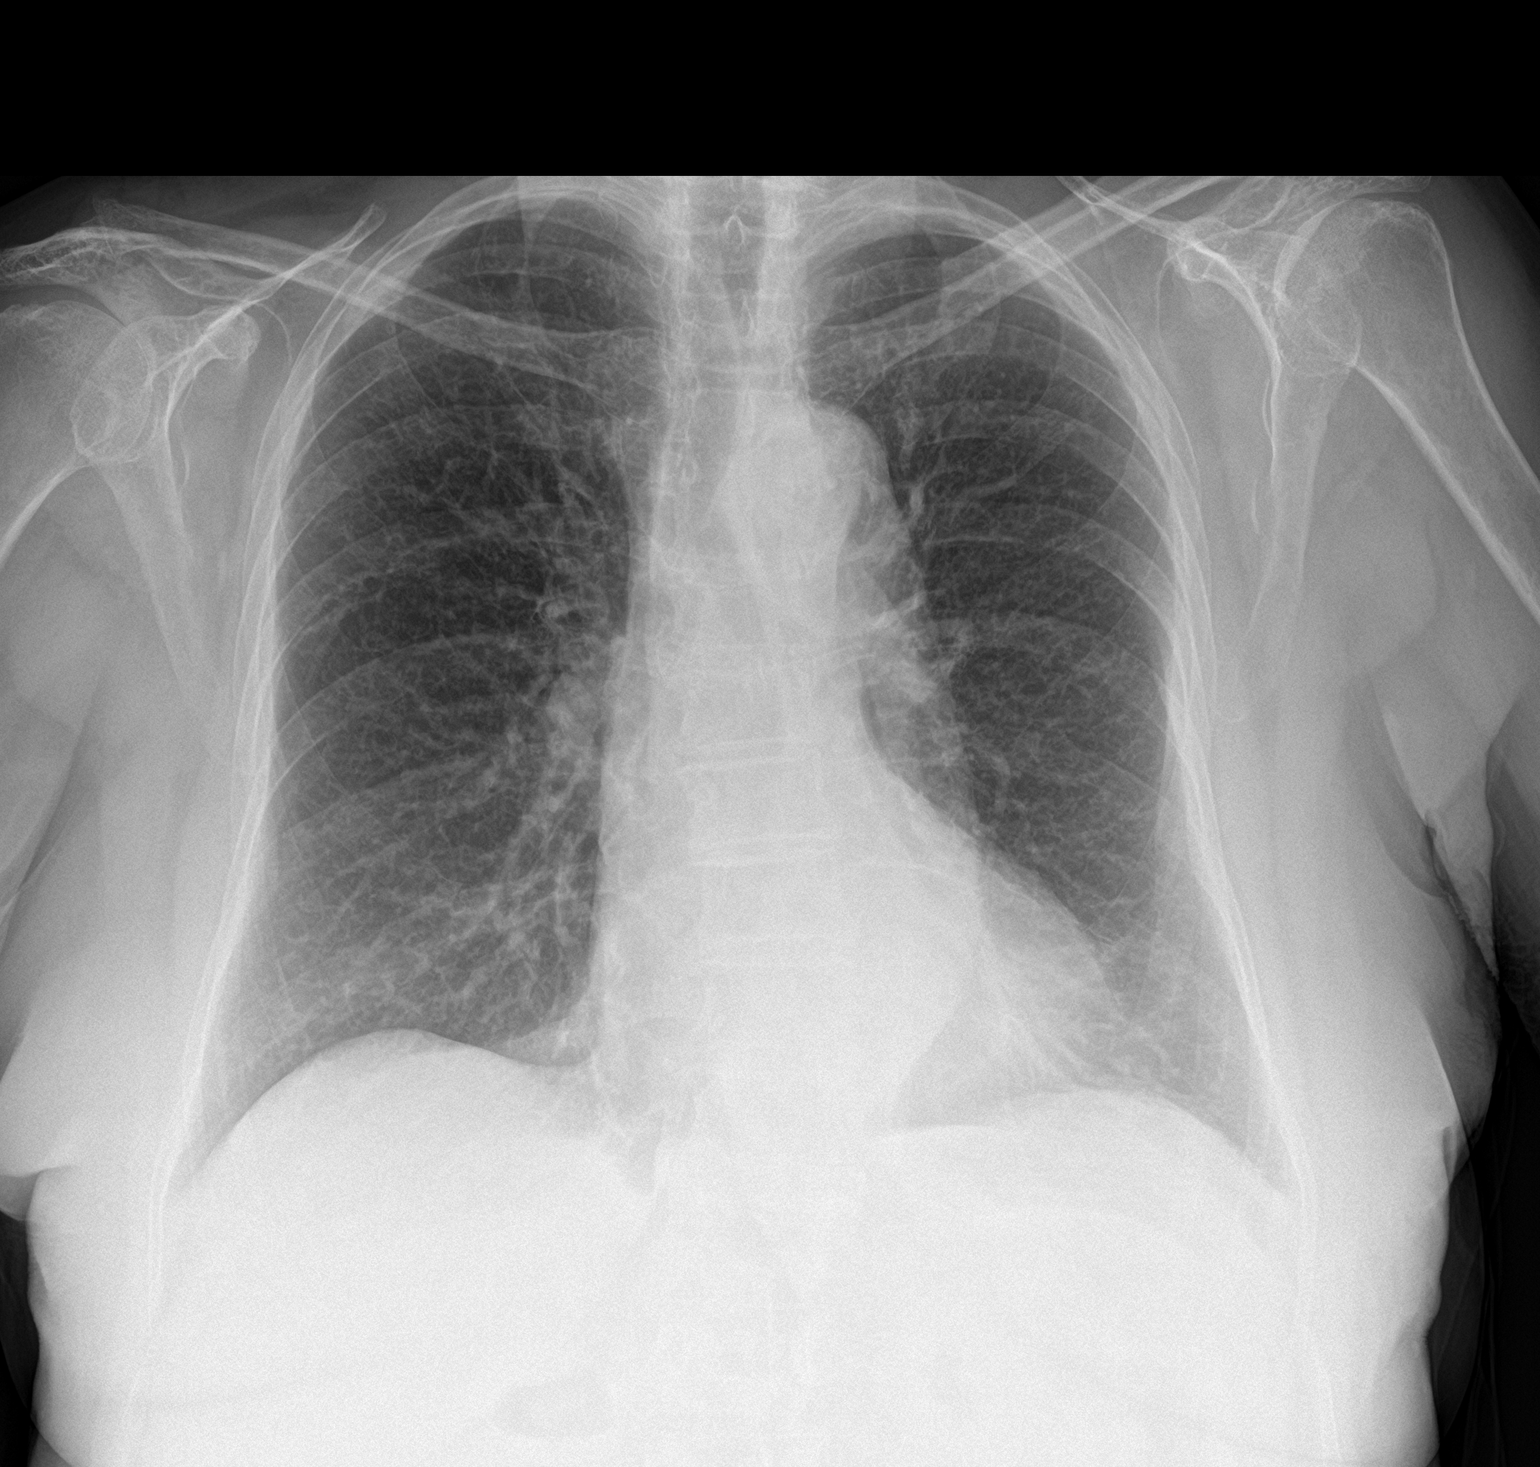
[im 2/2]
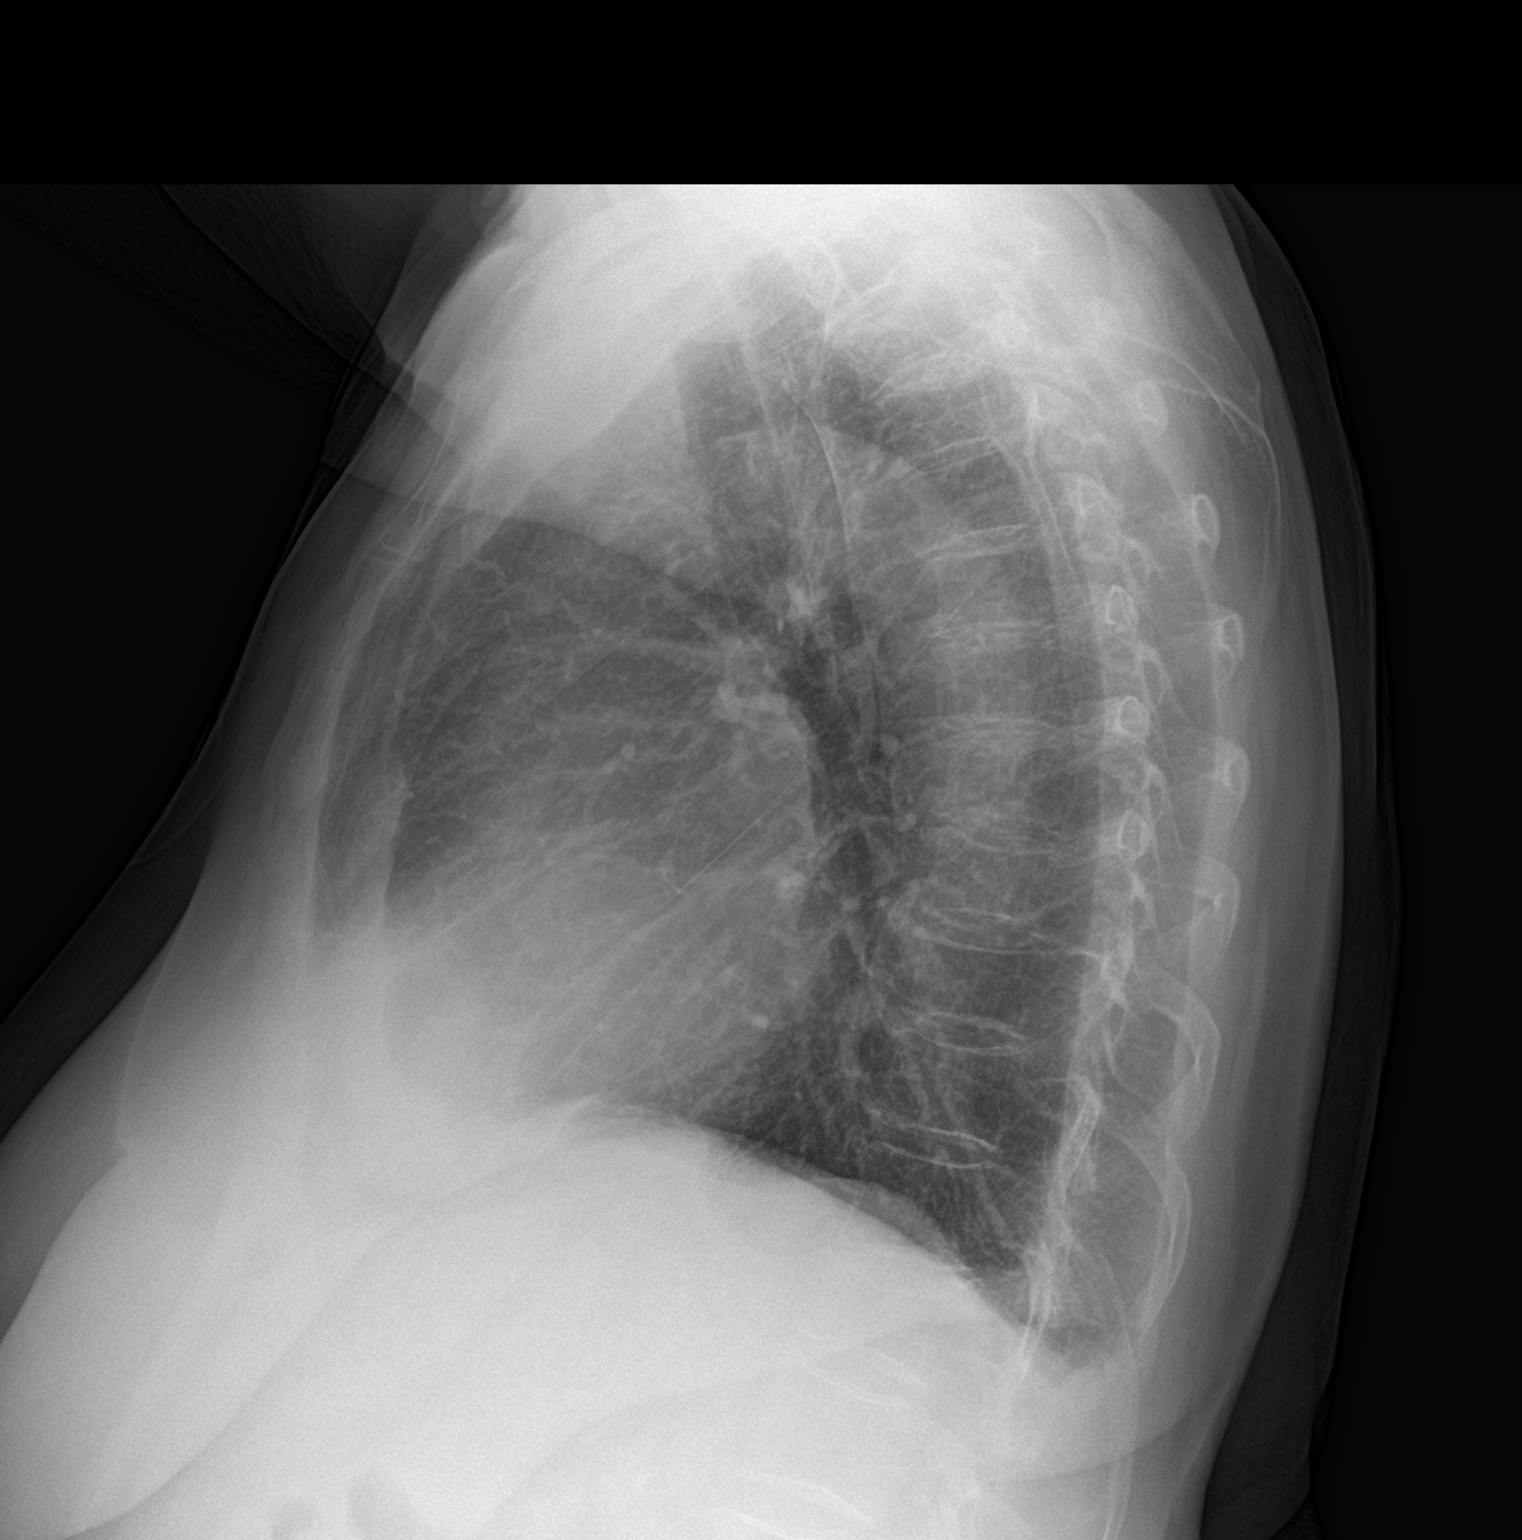

[2 of 2 positions shown; findings below may reference images not displayed]

FINDINGS: The lungs are clear. Heart size is normal. No pneumothorax or
pleural effusion. Aortic atherosclerosis is noted. No acute bony
abnormality.
IMPRESSION: No acute disease.

Atherosclerosis.

## 2019-03-08 ENCOUNTER — Ambulatory Visit
Admission: EM | Admit: 2019-03-08 | Discharge: 2019-03-08 | Disposition: A | Discharge status: Home / Self Care | Payer: Medicare Other | Attending: Family Medicine | Admitting: Family Medicine

## 2019-03-08 ENCOUNTER — Other Ambulatory Visit: Payer: Self-pay

## 2019-03-08 ENCOUNTER — Encounter: Payer: Self-pay | Admitting: Emergency Medicine

## 2019-03-08 DIAGNOSIS — R319 Hematuria, unspecified: Secondary | ICD-10-CM | POA: Diagnosis present

## 2019-03-08 DIAGNOSIS — N390 Urinary tract infection, site not specified: Secondary | ICD-10-CM

## 2019-03-08 LAB — URINALYSIS, COMPLETE (UACMP) WITH MICROSCOPIC
Bilirubin Urine: NEGATIVE
Glucose, UA: NEGATIVE mg/dL
Ketones, ur: NEGATIVE mg/dL
Nitrite: NEGATIVE
Protein, ur: NEGATIVE mg/dL
Specific Gravity, Urine: 1.015 (ref 1.005–1.030)
WBC, UA: >50 WBC/hpf (ref 0–5)
pH: 6 (ref 5.0–8.0)

## 2019-03-08 MED ORDER — CEPHALEXIN 500 MG PO CAPS: 500.0000 mg | ORAL_CAPSULE | Freq: Two times a day (BID) | ORAL | 0 refills | Status: AC

## 2019-03-08 NOTE — Discharge Instructions (Addendum)
Take medication as prescribed.   Follow up with your primary care physician this week as needed. Return to Urgent care for new or worsening concerns.

## 2019-03-08 NOTE — ED Triage Notes (Signed)
Pt c/o dysuria, urinary frequency and retention. Started about 3 days ago. Denies vaginal itching or discharge.

## 2019-03-08 NOTE — ED Provider Notes (Signed)
[facility] ____________________________________________  Time seen: Approximately 2:46 PM  I have reviewed the triage vital signs and the nursing notes.   HISTORY  Chief Complaint Dysuria   HPI Jaime Berger is a 85 y.o. female presenting for evaluation of 2 to 3 days of urinary frequency, urinary urgency and burning with urination.  Denies abdominal pain, atypical back pain, fevers.  Continues to eat and drink well.  No recent cough, congestion, chest pain, shortness of breath or fevers.  Denies aggravating or alleviating factors.  Reports otherwise doing well.  Hortencia Pilar, MD: PCP   Past Medical History:  Diagnosis Date  . Arthritis    legs, hands, "everywhere"  . Asthma   . Carotid artery occlusion   . Complication of anesthesia    Pt reports during Colonoscopy at [facility] was "overdosed" on meds and died and was brought back  . Family history of adverse reaction to anesthesia    nausea  . GERD (gastroesophageal reflux disease)   . Heart murmur    "Yrs ago", no mention recently  . Hyperlipidemia   . Hypertension   . Liver enzyme elevation   . Osteoporosis    back  . Prosthetic eye globe    right  . Vertigo     Patient Active Problem List   Diagnosis Date Noted  . Chest pain, rule out acute myocardial infarction 10/21/2016  . Compression fracture of vertebral column ([place]) 12/02/2015  . Pancreatitis 11/05/2015  . Awareness of heartbeats 09/19/2015  . AG (atrophic gastritis) 03/03/2015  . Blind one eye 01/01/2015  . Drug intolerance 12/03/2014  . HLD (hyperlipidemia) 03/06/2012  . Pseudoaphakia 02/17/2012  . Airway hyperreactivity 01/03/2011  . Controlled diabetes mellitus type II without complication ([place]) XX123456  . Acid reflux 01/03/2011  . Benign essential HTN 01/03/2011  . 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 OP (osteoporosis) 01/03/2011  . Syncope and collapse 01/03/2011  . Malignant melanoma of skin ([place]) 01/03/2011   . Controlled type 2 diabetes mellitus without complication ([place]) XX123456    Past Surgical History:  Procedure Laterality Date  . ABDOMINAL HYSTERECTOMY    . APPENDECTOMY    . BREAST SURGERY Right    lumpectomy  . CHOLECYSTECTOMY    . ESOPHAGOGASTRODUODENOSCOPY N/A 12/04/2014   Procedure: ESOPHAGOGASTRODUODENOSCOPY (EGD);  Surgeon: Lucilla Lame, MD;  Location: [place];  Service: Gastroenterology;  Laterality: N/A;  REFERRING DR Hortencia Pilar  . EYE SURGERY Right 1975    prosthesis after melanoma  . HAND / FINGER LESION EXCISION     "nodules"     No current facility-administered medications for this encounter.   Current Outpatient Medications:  .  albuterol (PROVENTIL HFA;VENTOLIN HFA) 108 (90 BASE) MCG/ACT inhaler, Inhale into the lungs every 6 (six) hours as needed for wheezing or shortness of breath., Disp: , Rfl:  .  budesonide-formoterol (SYMBICORT) 160-4.5 MCG/ACT inhaler, Inhale 2 puffs into the lungs 2 (two) times daily. Usually only uses in AM, Disp: , Rfl:  .  Carbonyl Iron 15 MG/1.25ML SUSP, Take by mouth., Disp: , Rfl:  .  diltiazem (CARDIZEM CD) 120 MG 24 hr capsule, Take by mouth., Disp: , Rfl:  .  diltiazem (DILACOR XR) 120 MG 24 hr capsule, Take 120 mg by mouth daily., Disp: , Rfl:  .  esomeprazole (NEXIUM) 40 MG capsule, Take by mouth., Disp: , Rfl:  .  ferrous sulfate 325 (65 FE) MG tablet, Take by mouth., Disp: , Rfl:  .  hydrochlorothiazide (HYDRODIURIL) 12.5 MG  tablet, Take by mouth., Disp: , Rfl:  .  losartan (COZAAR) 100 MG tablet, Take 100 mg by mouth daily. , Disp: , Rfl:  .  rOPINIRole (REQUIP) 0.25 MG tablet, Take by mouth., Disp: , Rfl:  .  aspirin 325 MG tablet, Take 325 mg by mouth every 6 (six) hours as needed., Disp: , Rfl:  .  cephALEXin (KEFLEX) 500 MG capsule, Take 1 capsule (500 mg total) by mouth 2 (two) times daily for 7 days., Disp: 14 capsule, Rfl: 0  Allergies Lipitor [atorvastatin], Lovastatin, Diphenhydramine hcl, Doxycycline,  Nsaids, Strawberry (diagnostic), Sucralfate, Sulfa antibiotics, and Metformin and related  History reviewed. No pertinent family history.  Social History Social History   Tobacco Use  . Smoking status: Never Smoker  . Smokeless tobacco: Never Used  Substance Use Topics  . Alcohol use: No  . Drug use: No    Review of Systems Constitutional: No fever Cardiovascular: Denies chest pain. Respiratory: Denies shortness of breath. Gastrointestinal: No abdominal pain.  No nausea, no vomiting.  Genitourinary: Positive for dysuria. Musculoskeletal: Negative for back pain. Skin: Negative for rash.   ____________________________________________   PHYSICAL EXAM:  VITAL SIGNS: ED Triage Vitals  Enc Vitals Group     BP 03/08/19 1405 (!) 182/79     Pulse Rate 03/08/19 1405 70     Resp 03/08/19 1405 18     Temp 03/08/19 1405 98.4 F (36.9 C)     Temp Source 03/08/19 1405 Oral     SpO2 03/08/19 1405 97 %     Weight 03/08/19 1400 135 lb (61.2 kg)     Height 03/08/19 1400 5\' 1"  (1.549 m)     Head Circumference --      Peak Flow --      Pain Score 03/08/19 1400 3     Pain Loc --      Pain Edu? --      Excl. in [place]? --     Constitutional: Alert and oriented. Well appearing and in no acute distress. Eyes: Conjunctivae are normal.  ENT      Head: Normocephalic and atraumatic. Cardiovascular: Normal rate, regular rhythm. Grossly normal heart sounds.  Good peripheral circulation. Respiratory: Normal respiratory effort without tachypnea nor retractions. Breath sounds are clear and equal bilaterally. No wheezes, rales, rhonchi. Gastrointestinal: Soft and nontender. No CVA tenderness. Musculoskeletal:  Steady gait.  Neurologic:  Normal speech and language. Skin:  Skin is warm, dry. Psychiatric: Mood and affect are normal. Speech and behavior are normal. Patient exhibits appropriate insight and judgment   ___________________________________________   LABS (all labs ordered are  listed, but only abnormal results are displayed)  Labs Reviewed  URINALYSIS, COMPLETE (UACMP) WITH MICROSCOPIC - Abnormal; Notable for the following components:      Result Value   APPearance CLOUDY (*)    Hgb urine dipstick TRACE (*)    Leukocytes,Ua LARGE (*)    Bacteria, UA MANY (*)    All other components within normal limits  URINE CULTURE   ____________________________________________ PROCEDURES Procedures   INITIAL IMPRESSION / ASSESSMENT AND PLAN / ED COURSE  Pertinent labs & imaging results that were available during my care of the patient were reviewed by me and considered in my medical decision making (see chart for details).  Presenting for evaluation of dysuria.  Urinalysis reviewed, UTI.  Will treat with oral Keflex.  Encourage supportive care, monitoring.Discussed indication, risks and benefits of medications with patient.  Discussed follow up with Primary care physician this week  as needed. Discussed follow up and return parameters including no resolution or any worsening concerns. Patient verbalized understanding and agreed to plan.   ____________________________________________   FINAL CLINICAL IMPRESSION(S) / ED DIAGNOSES  Final diagnoses:  Urinary tract infection with hematuria, site unspecified     ED Discharge Orders         Ordered    cephALEXin (KEFLEX) 500 MG capsule  2 times daily     03/08/19 1432           Note: This dictation was prepared with Dragon dictation along with smaller phrase technology. Any transcriptional errors that result from this process are unintentional.         Marylene Land, NP 03/08/19 1448

## 2019-03-11 ENCOUNTER — Telehealth (HOSPITAL_COMMUNITY): Payer: Self-pay | Admitting: Emergency Medicine

## 2019-03-11 LAB — URINE CULTURE: Culture: >=100000 — AB

## 2019-03-11 MED ORDER — ONDANSETRON HCL 4 MG PO TABS: 4.0000 mg | ORAL_TABLET | Freq: Four times a day (QID) | ORAL | 0 refills | Status: AC

## 2019-03-11 MED ORDER — NITROFURANTOIN MONOHYD MACRO 100 MG PO CAPS: 100.0000 mg | ORAL_CAPSULE | Freq: Two times a day (BID) | ORAL | 0 refills | Status: DC

## 2019-03-11 NOTE — Telephone Encounter (Signed)
Review pt chart with Dr. Meda Coffee. Per Dr. Meda Coffee, switch to macrobid bid x5 days. Patient contacted and made aware of    results, all questions answered

## 2019-03-11 NOTE — Telephone Encounter (Signed)
Pt called requesting zofran. Okay to send per kelly tate. Left VM for pt.

## 2020-08-24 ENCOUNTER — Other Ambulatory Visit: Payer: Self-pay

## 2020-08-24 ENCOUNTER — Ambulatory Visit
Admission: EM | Admit: 2020-08-24 | Discharge: 2020-08-24 | Disposition: A | Discharge status: Home / Self Care | Payer: Medicare Other | Attending: Family Medicine | Admitting: Family Medicine

## 2020-08-24 ENCOUNTER — Encounter: Payer: Self-pay | Admitting: Emergency Medicine

## 2020-08-24 DIAGNOSIS — R0789 Other chest pain: Secondary | ICD-10-CM

## 2020-08-24 DIAGNOSIS — R350 Frequency of micturition: Secondary | ICD-10-CM | POA: Diagnosis not present

## 2020-08-24 DIAGNOSIS — H8392 Unspecified disease of left inner ear: Secondary | ICD-10-CM | POA: Diagnosis present

## 2020-08-24 LAB — URINALYSIS, COMPLETE (UACMP) WITH MICROSCOPIC
Bacteria, UA: NONE SEEN
Bilirubin Urine: NEGATIVE
Glucose, UA: NEGATIVE mg/dL
Hgb urine dipstick: NEGATIVE
Ketones, ur: NEGATIVE mg/dL
Leukocytes,Ua: NEGATIVE
Nitrite: NEGATIVE
Protein, ur: NEGATIVE mg/dL
Specific Gravity, Urine: 1.015 (ref 1.005–1.030)
WBC, UA: NONE SEEN WBC/hpf (ref 0–5)
pH: 7 (ref 5.0–8.0)

## 2020-08-24 MED ORDER — MECLIZINE HCL 25 MG PO TABS: 25.0000 mg | ORAL_TABLET | Freq: Three times a day (TID) | ORAL | 0 refills | Status: AC | PRN

## 2020-08-24 NOTE — ED Triage Notes (Signed)
Patient c/o urinary frequency and dysuria that started 2-3 days ago. She states the last time she felt like this she had a UTI.

## 2020-08-24 NOTE — ED Provider Notes (Signed)
[facility]    CSN: [id number] Arrival date & time: 08/24/20  1208      History   Chief Complaint Chief Complaint  Patient presents with  . Urinary Frequency  . Dysuria    HPI Jaime Berger is a 87 y.o. female.   HPI   87 year old female here for evaluation of increased urinary frequency x2 to 3 days.  Patient reports that she is also had a "drunk feeling" that she typically has when she has a urinary tract infection.  She denies any pain when she urinates she does has a sensation as she describes it.  She denies any blood in her urine any cloudiness to the urine, any fever, or back pain.  Past Medical History:  Diagnosis Date  . Arthritis    legs, hands, "everywhere"  . Asthma   . Carotid artery occlusion   . Complication of anesthesia    Pt reports during Colonoscopy at [facility] was "overdosed" on meds and died and was brought back  . Family history of adverse reaction to anesthesia    nausea  . GERD (gastroesophageal reflux disease)   . Heart murmur    "Yrs ago", no mention recently  . Hyperlipidemia   . Hypertension   . Liver enzyme elevation   . Osteoporosis    back  . Prosthetic eye globe    right  . Vertigo     Patient Active Problem List   Diagnosis Date Noted  . Chest pain, rule out acute myocardial infarction 10/21/2016  . Compression fracture of vertebral column ([place]) 12/02/2015  . Pancreatitis 11/05/2015  . Awareness of heartbeats 09/19/2015  . AG (atrophic gastritis) 03/03/2015  . Blind one eye 01/01/2015  . Drug intolerance 12/03/2014  . HLD (hyperlipidemia) 03/06/2012  . Pseudoaphakia 02/17/2012  . Airway hyperreactivity 01/03/2011  . Controlled diabetes mellitus type II without complication ([place]) 18/84/1660  . Acid reflux 01/03/2011  . Benign essential HTN 01/03/2011  . 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 OP (osteoporosis) 01/03/2011  . Syncope and collapse 01/03/2011  . Malignant melanoma of skin ([employer])  01/03/2011  . Controlled type 2 diabetes mellitus without complication ([place]) 63/07/6008    Past Surgical History:  Procedure Laterality Date  . ABDOMINAL HYSTERECTOMY    . APPENDECTOMY    . BREAST SURGERY Right    lumpectomy  . CHOLECYSTECTOMY    . ESOPHAGOGASTRODUODENOSCOPY N/A 12/04/2014   Procedure: ESOPHAGOGASTRODUODENOSCOPY (EGD);  Surgeon: Lucilla Lame, MD;  Location: [place];  Service: Gastroenterology;  Laterality: N/A;  REFERRING DR Hortencia Pilar  . EYE SURGERY Right 1975    prosthesis after melanoma  . HAND / FINGER LESION EXCISION     "nodules"    OB History   No obstetric history on file.      Home Medications    Prior to Admission medications   Medication Sig Start Date End Date Taking? Authorizing Provider  albuterol (PROVENTIL HFA;VENTOLIN HFA) 108 (90 BASE) MCG/ACT inhaler Inhale into the lungs every 6 (six) hours as needed for wheezing or shortness of breath.   Yes [provider]  aspirin 325 MG tablet Take 325 mg by mouth every 6 (six) hours as needed.   Yes [provider]  budesonide-formoterol (SYMBICORT) 160-4.5 MCG/ACT inhaler Inhale 2 puffs into the lungs 2 (two) times daily. Usually only uses in AM   Yes [provider]  Carbonyl Iron 15 MG/1.25ML SUSP Take by mouth. 05/02/18  Yes [provider]  diltiazem (DILACOR XR)  120 MG 24 hr capsule Take 120 mg by mouth daily.   Yes [provider]  losartan (COZAAR) 100 MG tablet Take 100 mg by mouth daily.    Yes [provider]  meclizine (ANTIVERT) 25 MG tablet Take 1 tablet (25 mg total) by mouth 3 (three) times daily as needed for dizziness. 08/24/20  Yes Margarette Canada, NP  ondansetron (ZOFRAN) 4 MG tablet Take 1 tablet (4 mg total) by mouth every 6 (six) hours. 03/11/19  Yes Sharion Balloon, NP  rOPINIRole (REQUIP) 0.25 MG tablet Take by mouth. 05/02/18  Yes [provider]  esomeprazole (NEXIUM) 40 MG capsule Take by mouth. 03/26/18 03/26/19   [provider]  ferrous sulfate 325 (65 FE) MG tablet Take by mouth. 05/24/18 05/24/19  [provider]  hydrochlorothiazide (HYDRODIURIL) 12.5 MG tablet Take by mouth. 03/01/19 02/29/20  [provider]  carvedilol (COREG) 12.5 MG tablet Take 1 tablet (12.5 mg total) by mouth 2 (two) times daily with a meal. 11/08/15 03/08/19  Nicholes Mango, MD    Family History History reviewed. No pertinent family history.  Social History Social History   Tobacco Use  . Smoking status: Never Smoker  . Smokeless tobacco: Never Used  Vaping Use  . Vaping Use: Never used  Substance Use Topics  . Alcohol use: No  . Drug use: No     Allergies   Lipitor [atorvastatin], Lovastatin, Diphenhydramine hcl, Doxycycline, Nsaids, Strawberry (diagnostic), Sucralfate, Sulfa antibiotics, and Metformin and related   Review of Systems Review of Systems  Gastrointestinal: Negative for abdominal pain.  Genitourinary: Positive for frequency. Negative for dysuria, hematuria and urgency.  Musculoskeletal: Negative for back pain.  Neurological: Positive for light-headedness.     Physical Exam Triage Vital Signs ED Triage Vitals  Enc Vitals Group     BP 08/24/20 1233 (!) 176/69     Pulse Rate 08/24/20 1233 72     Resp 08/24/20 1233 18     Temp 08/24/20 1233 98.9 F (37.2 C)     Temp Source 08/24/20 1233 Oral     SpO2 08/24/20 1233 98 %     Weight 08/24/20 1232 134 lb 14.7 oz (61.2 kg)     Height 08/24/20 1232 5\' 1"  (1.549 m)     Head Circumference --      Peak Flow --      Pain Score 08/24/20 1232 0     Pain Loc --      Pain Edu? --      Excl. in [place]? --    No data found.  Updated Vital Signs BP (!) 176/69 (BP Location: Left Arm)   Pulse 72   Temp 98.9 F (37.2 C) (Oral)   Resp 18   Ht 5\' 1"  (1.549 m)   Wt 134 lb 14.7 oz (61.2 kg)   SpO2 98%   BMI 25.49 kg/m   Visual Acuity Right Eye Distance:   Left Eye Distance:   Bilateral Distance:    Right Eye Near:    Left Eye Near:    Bilateral Near:     Physical Exam Vitals and nursing note reviewed.  Constitutional:      Appearance: Normal appearance.  HENT:     Head: Normocephalic and atraumatic.     Right Ear: Ear canal and external ear normal.     Left Ear: Ear canal and external ear normal.     Ears:     Comments: Minimal serous effusion behind left tympanic membrane.  Cardiovascular:     Rate and Rhythm: Normal rate and regular rhythm.     Pulses: Normal pulses.     Heart sounds: Normal heart sounds. No murmur heard. No gallop.   Pulmonary:     Effort: Pulmonary effort is normal.     Breath sounds: Normal breath sounds. No wheezing or rhonchi.  Abdominal:     Tenderness: There is no right CVA tenderness or left CVA tenderness.  Skin:    General: Skin is warm and dry.     Capillary Refill: Capillary refill takes less than 2 seconds.     Findings: No erythema.  Neurological:     General: No focal deficit present.     Mental Status: She is alert and oriented to person, place, and time.  Psychiatric:        Mood and Affect: Mood normal.        Behavior: Behavior normal.        Thought Content: Thought content normal.        Judgment: Judgment normal.      UC Treatments / Results  Labs (all labs ordered are listed, but only abnormal results are displayed) Labs Reviewed  URINALYSIS, COMPLETE (UACMP) WITH MICROSCOPIC    EKG   Radiology No results found.  Procedures Procedures (including critical care time)  Medications Ordered in UC Medications - No data to display  Initial Impression / Assessment and Plan / UC Course  I have reviewed the triage vital signs and the nursing notes.  Pertinent labs & imaging results that were available during my care of the patient were reviewed by me and considered in my medical decision making (see chart for details).   87 year old female here for evaluation of increased urinary frequency x2 to 3 days and a feeling of being "drunk".   Patient reports that the last time she felt like this is when she had a urinary tract infection.  Urine collected at triage and is completely unremarkable.  Patient states she also has a history of inner ear problems.  Physical exam does reveal a very slight serous effusion behind her left tympanic membrane but no erythema or injection of the membrane or external auditory canal bilaterally.  Patient also reports that she has had a gnawing in the center of her chest for 3 to 4 days.  She would not really describe it as a pain when questioned or pressure just a gnawing.  Will obtain EKG.  EKG shows normal sinus rhythm, normal axis, ventricular rate of 62, PR interval is 178, QRS is 78.  No change when compared to EKG from October 26, 2018.  We will discharge patient home with left serous effusion and treat with meclizine.   Final Clinical Impressions(s) / UC Diagnoses   Final diagnoses:  Disorder of left inner ear     Discharge Instructions     Use 1/2 to 1 tablet of the meclizine every 8 hours as needed for your off-balance sensation.  If your symptoms do not improve you need to follow-up with your primary care provider.    ED Prescriptions    Medication Sig Dispense Auth. Provider   meclizine (ANTIVERT) 25 MG tablet Take 1 tablet (25 mg total) by mouth 3 (three) times daily as needed for dizziness. 30 tablet Margarette Canada, NP     PDMP not reviewed this encounter.   Margarette Canada, NP 08/24/20 1334

## 2020-08-24 NOTE — Discharge Instructions (Addendum)
Use 1/2 to 1 tablet of the meclizine every 8 hours as needed for your off-balance sensation.  If your symptoms do not improve you need to follow-up with your primary care provider.

## 2021-08-06 ENCOUNTER — Ambulatory Visit
Admission: EM | Admit: 2021-08-06 | Discharge: 2021-08-06 | Disposition: A | Discharge status: Home / Self Care | Payer: Medicare Other | Attending: Medical Oncology | Admitting: Medical Oncology

## 2021-08-06 ENCOUNTER — Other Ambulatory Visit: Payer: Self-pay

## 2021-08-06 ENCOUNTER — Encounter: Payer: Self-pay | Admitting: Emergency Medicine

## 2021-08-06 DIAGNOSIS — R300 Dysuria: Secondary | ICD-10-CM | POA: Diagnosis present

## 2021-08-06 LAB — WET PREP, GENITAL
Clue Cells Wet Prep HPF POC: NONE SEEN
Sperm: NONE SEEN
Trich, Wet Prep: NONE SEEN
WBC, Wet Prep HPF POC: <10 — AB (ref <10)
Yeast Wet Prep HPF POC: NONE SEEN

## 2021-08-06 LAB — URINALYSIS, ROUTINE W REFLEX MICROSCOPIC
Bilirubin Urine: NEGATIVE
Glucose, UA: NEGATIVE mg/dL
Hgb urine dipstick: NEGATIVE
Ketones, ur: NEGATIVE mg/dL
Leukocytes,Ua: NEGATIVE
Nitrite: NEGATIVE
Protein, ur: NEGATIVE mg/dL
Specific Gravity, Urine: 1.01 (ref 1.005–1.030)
pH: 7 (ref 5.0–8.0)

## 2021-08-06 NOTE — ED Triage Notes (Signed)
Patient c/o urinary urgency and burning when urinating that stared this morning.

## 2021-08-06 NOTE — ED Provider Notes (Addendum)
[facility]    CSN: [id number] Arrival date & time: 08/06/21  1543      History   Chief Complaint Chief Complaint  Patient presents with   Dysuria    HPI Jaime Berger is a 88 y.o. female.   HPI  Dysuria: Pt states that she has had urinary urgency and burning with urination that started this morning. She denies fevers, confusion, back pain or vomiting. She has tried hydration with water for symptoms. No recent UTIs, procedures or inpatient hospital stays.   Past Medical History:  Diagnosis Date   Arthritis    legs, hands, "everywhere"   Asthma    Carotid artery occlusion    Complication of anesthesia    Pt reports during Colonoscopy at [facility] was "overdosed" on meds and died and was brought back   Family history of adverse reaction to anesthesia    nausea   GERD (gastroesophageal reflux disease)    Heart murmur    "Yrs ago", no mention recently   Hyperlipidemia    Hypertension    Liver enzyme elevation    Osteoporosis    back   Prosthetic eye globe    right   Vertigo     Patient Active Problem List   Diagnosis Date Noted   Chest pain, rule out acute myocardial infarction 10/21/2016   Compression fracture of vertebral column ([place]) 12/02/2015   Pancreatitis 11/05/2015   Awareness of heartbeats 09/19/2015   AG (atrophic gastritis) 03/03/2015   Blind one eye 01/01/2015   Drug intolerance 12/03/2014   HLD (hyperlipidemia) 03/06/2012   Pseudoaphakia 02/17/2012   Airway hyperreactivity 01/03/2011   Controlled diabetes mellitus type II without complication ([place]) 86/76/1950   Acid reflux 01/03/2011   Benign essential HTN 01/03/2011   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   OP (osteoporosis) 01/03/2011   Syncope and collapse 01/03/2011   Malignant melanoma of skin ([place]) 01/03/2011   Controlled type 2 diabetes mellitus without complication ([place]) 93/26/7124    Past Surgical History:  Procedure Laterality Date   ABDOMINAL HYSTERECTOMY      APPENDECTOMY     BREAST SURGERY Right    lumpectomy   CHOLECYSTECTOMY     ESOPHAGOGASTRODUODENOSCOPY N/A 12/04/2014   Procedure: ESOPHAGOGASTRODUODENOSCOPY (EGD);  Surgeon: Lucilla Lame, MD;  Location: [place];  Service: Gastroenterology;  Laterality: N/A;  REFERRING DR Napoleon SURGERY Right 1975    prosthesis after melanoma   HAND / FINGER LESION EXCISION     "nodules"    OB History   No obstetric history on file.      Home Medications    Prior to Admission medications   Medication Sig Start Date End Date Taking? Authorizing Provider  albuterol (PROVENTIL HFA;VENTOLIN HFA) 108 (90 BASE) MCG/ACT inhaler Inhale into the lungs every 6 (six) hours as needed for wheezing or shortness of breath.   Yes [provider]  budesonide-formoterol (SYMBICORT) 160-4.5 MCG/ACT inhaler Inhale 2 puffs into the lungs 2 (two) times daily. Usually only uses in AM   Yes [provider]  diltiazem (DILACOR XR) 120 MG 24 hr capsule Take 120 mg by mouth daily.   Yes [provider]  losartan (COZAAR) 100 MG tablet Take 100 mg by mouth daily.    Yes [provider]  rOPINIRole (REQUIP) 0.25 MG tablet Take by mouth. 05/02/18  Yes [provider]  aspirin 325 MG tablet Take 325 mg by mouth every 6 (six) hours as needed.  [provider]  Carbonyl Iron 15 MG/1.25ML SUSP Take by mouth. 05/02/18   [provider]  esomeprazole (NEXIUM) 40 MG capsule Take by mouth. 03/26/18 03/26/19  [provider]  ferrous sulfate 325 (65 FE) MG tablet Take by mouth. 05/24/18 05/24/19  [provider]  hydrochlorothiazide (HYDRODIURIL) 12.5 MG tablet Take by mouth. 03/01/19 02/29/20  [provider]  meclizine (ANTIVERT) 25 MG tablet Take 1 tablet (25 mg total) by mouth 3 (three) times daily as needed for dizziness. 08/24/20   Margarette Canada, NP  ondansetron (ZOFRAN) 4 MG tablet Take 1 tablet (4 mg total) by mouth every 6  (six) hours. 03/11/19   Sharion Balloon, NP  carvedilol (COREG) 12.5 MG tablet Take 1 tablet (12.5 mg total) by mouth 2 (two) times daily with a meal. 11/08/15 03/08/19  Nicholes Mango, MD    Family History History reviewed. No pertinent family history.  Social History Social History   Tobacco Use   Smoking status: Never   Smokeless tobacco: Never  Vaping Use   Vaping Use: Never used  Substance Use Topics   Alcohol use: No   Drug use: No     Allergies   Lipitor [atorvastatin], Lovastatin, Diphenhydramine hcl, Doxycycline, Nsaids, Strawberry (diagnostic), Sucralfate, Sulfa antibiotics, and Metformin and related   Review of Systems Review of Systems  As stated above in HPI Physical Exam Triage Vital Signs ED Triage Vitals  Enc Vitals Group     BP 08/06/21 1617 (!) 170/89     Pulse Rate 08/06/21 1617 82     Resp 08/06/21 1617 15     Temp 08/06/21 1617 98.6 F (37 C)     Temp Source 08/06/21 1617 Oral     SpO2 08/06/21 1617 96 %     Weight 08/06/21 1615 134 lb 14.7 oz (61.2 kg)     Height 08/06/21 1615 5\' 1"  (1.549 m)     Head Circumference --      Peak Flow --      Pain Score 08/06/21 1615 2     Pain Loc --      Pain Edu? --      Excl. in [place]? --    No data found.  Updated Vital Signs BP (!) 170/89 (BP Location: Left Arm)    Pulse 82    Temp 98.6 F (37 C) (Oral)    Resp 15    Ht 5\' 1"  (1.549 m)    Wt 134 lb 14.7 oz (61.2 kg)    SpO2 96%    BMI 25.49 kg/m   Physical Exam Vitals and nursing note reviewed.  Constitutional:      General: She is not in acute distress.    Appearance: Normal appearance. She is not ill-appearing, toxic-appearing or diaphoretic.  HENT:     Head: Normocephalic and atraumatic.     Mouth/Throat:     Mouth: Mucous membranes are moist.  Cardiovascular:     Rate and Rhythm: Normal rate and regular rhythm.     Heart sounds: Normal heart sounds.  Pulmonary:     Effort: Pulmonary effort is normal.     Breath sounds: Normal breath sounds.   Abdominal:     General: Bowel sounds are normal. There is no distension.     Palpations: Abdomen is soft. There is no mass.     Tenderness: There is no abdominal tenderness. There is no right CVA tenderness, left CVA tenderness, guarding or rebound.     Hernia: No  hernia is present.  Musculoskeletal:     Cervical back: Neck supple.  Lymphadenopathy:     Cervical: No cervical adenopathy.  Skin:    General: Skin is warm.  Neurological:     Mental Status: She is alert and oriented to person, place, and time.     UC Treatments / Results  Labs (all labs ordered are listed, but only abnormal results are displayed) Labs Reviewed  WET PREP, GENITAL - Abnormal; Notable for the following components:      Result Value   WBC, Wet Prep HPF POC <10 (*)    All other components within normal limits  URINALYSIS, ROUTINE W REFLEX MICROSCOPIC    EKG   Radiology No results found.  Procedures Procedures (including critical care time)  Medications Ordered in UC Medications - No data to display  Initial Impression / Assessment and Plan / UC Course  I have reviewed the triage vital signs and the nursing notes.  Pertinent labs & imaging results that were available during my care of the patient were reviewed by me and considered in my medical decision making (see chart for details).     New. Wide differential. UA is reassuring. Will culture urine and have her hydrate with water. Call for ABX should symptoms worsen. Of note she declines BP adjustment or recheck as she has "doctoritis" and her BP always goes high when at the doctors office but lower at home. She will check and alert me if BP has not lowered into normal range.  Final Clinical Impressions(s) / UC Diagnoses   Final diagnoses:  None   Discharge Instructions   None    ED Prescriptions   None    PDMP not reviewed this encounter.   Hughie Closs, Hershal Coria 08/06/21 1716    Hughie Closs, PA-C 08/06/21 1725

## 2023-08-18 ENCOUNTER — Encounter: Payer: Self-pay | Admitting: Emergency Medicine

## 2023-08-18 ENCOUNTER — Ambulatory Visit
Admission: EM | Admit: 2023-08-18 | Discharge: 2023-08-18 | Disposition: A | Discharge status: Home / Self Care | Payer: Medicare Other | Attending: Family Medicine | Admitting: Family Medicine

## 2023-08-18 DIAGNOSIS — R300 Dysuria: Secondary | ICD-10-CM | POA: Diagnosis not present

## 2023-08-18 LAB — URINALYSIS, W/ REFLEX TO CULTURE (INFECTION SUSPECTED)
Bilirubin Urine: NEGATIVE
Glucose, UA: NEGATIVE mg/dL
Hgb urine dipstick: NEGATIVE
Ketones, ur: NEGATIVE mg/dL
Leukocytes,Ua: NEGATIVE
Nitrite: NEGATIVE
Protein, ur: NEGATIVE mg/dL
Specific Gravity, Urine: 1.01 (ref 1.005–1.030)
pH: 5.5 (ref 5.0–8.0)

## 2023-08-18 MED ORDER — CEPHALEXIN 500 MG PO CAPS: 500.0000 mg | ORAL_CAPSULE | Freq: Two times a day (BID) | ORAL | 0 refills | Status: AC

## 2023-08-18 NOTE — ED Provider Notes (Addendum)
[facility]    CSN: [id number] Arrival date & time: 08/18/23  1828      History   Chief Complaint Chief Complaint  Patient presents with   Dysuria    HPI Jaime Berger is a 89 y.o. female presents for dysuria.  Patient reports a couple of days of urinary burning, malodorous urine and frequency.  Denies hematuria, fevers, nausea/vomiting, flank pain.  No vaginal discharge or STD concern.  She has not taken any OTC medications for symptoms since onset.  No other concerns at this time.  HPI  Past Medical History:  Diagnosis Date   Arthritis    legs, hands, "everywhere"   Asthma    Carotid artery occlusion    Complication of anesthesia    Pt reports during Colonoscopy at [facility] was "overdosed" on meds and died and was brought back   Family history of adverse reaction to anesthesia    nausea   GERD (gastroesophageal reflux disease)    Heart murmur    "Yrs ago", no mention recently   Hyperlipidemia    Hypertension    Liver enzyme elevation    Osteoporosis    back   Prosthetic eye globe    right   Vertigo     Patient Active Problem List   Diagnosis Date Noted   Chest pain, rule out acute myocardial infarction 10/21/2016   Compression fracture of vertebral column (HCC) 12/02/2015   Pancreatitis 11/05/2015   Awareness of heartbeats 09/19/2015   AG (atrophic gastritis) 03/03/2015   Blind one eye 01/01/2015   Drug intolerance 12/03/2014   HLD (hyperlipidemia) 03/06/2012   Pseudophakia 02/17/2012   Airway hyperreactivity 01/03/2011   Controlled diabetes mellitus type II without complication (HCC) 01/03/2011   Acid reflux 01/03/2011   Benign essential HTN 01/03/2011   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   OP (osteoporosis) 01/03/2011   Syncope and collapse 01/03/2011   Malignant melanoma of skin (HCC) 01/03/2011   Controlled type 2 diabetes mellitus without complication (HCC) 01/03/2011    Past Surgical History:  Procedure Laterality Date    ABDOMINAL HYSTERECTOMY     APPENDECTOMY     BREAST SURGERY Right    lumpectomy   CHOLECYSTECTOMY     ESOPHAGOGASTRODUODENOSCOPY N/A 12/04/2014   Procedure: ESOPHAGOGASTRODUODENOSCOPY (EGD);  Surgeon: Midge Minium, MD;  Location: [facility];  Service: Gastroenterology;  Laterality: N/A;  REFERRING DR Rolm Gala   EYE SURGERY Right 1975    prosthesis after melanoma   HAND / FINGER LESION EXCISION     "nodules"    OB History   No obstetric history on file.      Home Medications    Prior to Admission medications   Medication Sig Start Date End Date Taking? Authorizing Provider  cephALEXin (KEFLEX) 500 MG capsule Take 1 capsule (500 mg total) by mouth 2 (two) times daily for 7 days. 08/18/23 08/25/23 Yes Radford Pax, NP  albuterol (PROVENTIL HFA;VENTOLIN HFA) 108 (90 BASE) MCG/ACT inhaler Inhale into the lungs every 6 (six) hours as needed for wheezing or shortness of breath.    [provider]  aspirin 325 MG tablet Take 325 mg by mouth every 6 (six) hours as needed.    [provider]  budesonide-formoterol (SYMBICORT) 160-4.5 MCG/ACT inhaler Inhale 2 puffs into the lungs 2 (two) times daily. Usually only uses in AM    [provider]  Carbonyl Iron 15 MG/1.25ML SUSP Take by mouth. 05/02/18   [provider]  diltiazem [facility]  XR) 120 MG 24 hr capsule Take 120 mg by mouth daily.    [provider]  esomeprazole (NEXIUM) 40 MG capsule Take by mouth. 03/26/18 03/26/19  [provider]  ferrous sulfate 325 (65 FE) MG tablet Take by mouth. 05/24/18 05/24/19  [provider]  hydrochlorothiazide (HYDRODIURIL) 12.5 MG tablet Take by mouth. 03/01/19 02/29/20  [provider]  losartan (COZAAR) 100 MG tablet Take 100 mg by mouth daily.     [provider]  meclizine (ANTIVERT) 25 MG tablet Take 1 tablet (25 mg total) by mouth 3 (three) times daily as needed for dizziness. 08/24/20   Becky Augusta, NP   ondansetron (ZOFRAN) 4 MG tablet Take 1 tablet (4 mg total) by mouth every 6 (six) hours. 03/11/19   Mickie Bail, NP  rOPINIRole (REQUIP) 0.25 MG tablet Take by mouth. 05/02/18   [provider]  carvedilol (COREG) 12.5 MG tablet Take 1 tablet (12.5 mg total) by mouth 2 (two) times daily with a meal. 11/08/15 03/08/19  Ramonita Lab, MD    Family History History reviewed. No pertinent family history.  Social History Social History   Tobacco Use   Smoking status: Never   Smokeless tobacco: Never  Vaping Use   Vaping status: Never Used  Substance Use Topics   Alcohol use: No   Drug use: No     Allergies   Lipitor [atorvastatin], Lovastatin, Diphenhydramine hcl, Doxycycline, Nsaids, Strawberry (diagnostic), Sucralfate, Sulfa antibiotics, and Metformin and related   Review of Systems Review of Systems  Genitourinary:  Positive for dysuria.     Physical Exam Triage Vital Signs ED Triage Vitals  Encounter Vitals Group     BP 08/18/23 1849 (!) 185/94     Systolic BP Percentile --      Diastolic BP Percentile --      Pulse Rate 08/18/23 1849 75     Resp 08/18/23 1849 15     Temp 08/18/23 1849 98.4 F (36.9 C)     Temp Source 08/18/23 1849 Oral     SpO2 08/18/23 1849 94 %     Weight 08/18/23 1848 134 lb 14.7 oz (61.2 kg)     Height 08/18/23 1848 5\' 1"  (1.549 m)     Head Circumference --      Peak Flow --      Pain Score 08/18/23 1847 2     Pain Loc --      Pain Education --      Exclude from Growth Chart --    No data found.  Updated Vital Signs BP (!) 185/94 (BP Location: Left Arm)   Pulse 75   Temp 98.4 F (36.9 C) (Oral)   Resp 15   Ht 5\' 1"  (1.549 m)   Wt 134 lb 14.7 oz (61.2 kg)   SpO2 94%   BMI 25.49 kg/m   Visual Acuity Right Eye Distance:   Left Eye Distance:   Bilateral Distance:    Right Eye Near:   Left Eye Near:    Bilateral Near:     Physical Exam Vitals and nursing note reviewed.  Constitutional:      Appearance: Normal  appearance.  HENT:     Head: Normocephalic and atraumatic.  Eyes:     Pupils: Pupils are equal, round, and reactive to light.  Cardiovascular:     Rate and Rhythm: Normal rate.  Pulmonary:     Effort: Pulmonary effort is normal.  Abdominal:     Tenderness: There  is no right CVA tenderness or left CVA tenderness.  Skin:    General: Skin is warm and dry.  Neurological:     General: No focal deficit present.     Mental Status: She is alert and oriented to person, place, and time.  Psychiatric:        Mood and Affect: Mood normal.        Behavior: Behavior normal.      UC Treatments / Results  Labs (all labs ordered are listed, but only abnormal results are displayed) Labs Reviewed  URINALYSIS, W/ REFLEX TO CULTURE (INFECTION SUSPECTED) - Abnormal; Notable for the following components:      Result Value   Bacteria, UA FEW (*)    All other components within normal limits  URINE CULTURE   Basic Metabolic Panel Order: [id number] Component Ref Range & Units 2 wk ago  Sodium 135 - 145 mmol/L 135  Potassium 3.4 - 4.8 mmol/L 4.3  Chloride 98 - 107 mmol/L 97 Low   CO2 20.0 - 31.0 mmol/L 28  Anion Gap 5 - 14 mmol/L 10  BUN 9 - 23 mg/dL 14  Creatinine 1.91 - 4.78 mg/dL 2.95  BUN/Creatinine Ratio 19  eGFR CKD-EPI (2021) Female >=60 mL/min/1.64m2 76  Comment: eGFR calculated with CKD-EPI 2021 equation in accordance with [employer] and [employer] of Nephrology Task Force recommendations.  Glucose 70 - 99 mg/dL 98  Calcium 8.7 - 62.1 mg/dL 9.7  Resulting Agency [facility]   Specimen Collected: 08/03/23 11:03   Performed by: [facility] Last Resulted: 08/03/23 16:03  Received From: [facility]    EKG   Radiology No results found.  Procedures Procedures (including critical care time)  Medications Ordered in UC Medications - No data to display  Initial Impression / Assessment and Plan /  UC Course  I have reviewed the triage vital signs and the nursing notes.  Pertinent labs & imaging results that were available during my care of the patient were reviewed by me and considered in my medical decision making (see chart for details).     Reviewed exam and symptoms with patient.  No red flags.  Urine with trace bacteria will culture but given symptoms and history we will start Keflex twice daily for 7 days.  Lots of fluids and rest.  PCP follow-up as symptoms do not improve.  ER precautions reviewed and patient verbalized understanding. Final Clinical Impressions(s) / UC Diagnoses   Final diagnoses:  Dysuria     Discharge Instructions      Start Keflex twice a day for 7 days.  Lots of rest and fluids.  Please follow-up with your PCP if your symptoms do not improve.  Please go to the ER if you develop any worsening symptoms.  Hope you feel better soon!     ED Prescriptions     Medication Sig Dispense Auth. Provider   cephALEXin (KEFLEX) 500 MG capsule Take 1 capsule (500 mg total) by mouth 2 (two) times daily for 7 days. 14 capsule Radford Pax, NP      PDMP not reviewed this encounter.   Radford Pax, NP 08/18/23 1919    Radford Pax, NP 08/18/23 1919

## 2023-08-18 NOTE — ED Triage Notes (Signed)
Patient c/o dysuria and urinary frequency that started a week ago.

## 2023-08-18 NOTE — Discharge Instructions (Signed)
Start Keflex twice a day for 7 days.  Lots of rest and fluids.  Please follow-up with your PCP if your symptoms do not improve.  Please go to the ER if you develop any worsening symptoms.  Hope you feel better soon!

## 2023-08-21 LAB — URINE CULTURE
